# Patient Record
Sex: Male | Born: 1985 | Race: White | Hispanic: No | Marital: Married | Smoking: Never smoker
Health system: Southern US, Community
[De-identification: ages and names within clinical notes are randomized; demographics above are authoritative.]

## PROBLEM LIST (undated history)

## (undated) DIAGNOSIS — F909 Attention-deficit hyperactivity disorder, unspecified type: Secondary | ICD-10-CM

## (undated) DIAGNOSIS — F419 Anxiety disorder, unspecified: Secondary | ICD-10-CM

## (undated) DIAGNOSIS — T7840XA Allergy, unspecified, initial encounter: Secondary | ICD-10-CM

## (undated) DIAGNOSIS — E785 Hyperlipidemia, unspecified: Secondary | ICD-10-CM

## (undated) DIAGNOSIS — G4700 Insomnia, unspecified: Secondary | ICD-10-CM

## (undated) HISTORY — DX: Hyperlipidemia, unspecified: E78.5

## (undated) HISTORY — DX: Allergy, unspecified, initial encounter: T78.40XA

## (undated) HISTORY — DX: Insomnia, unspecified: G47.00

## (undated) HISTORY — DX: Anxiety disorder, unspecified: F41.9

## (undated) HISTORY — DX: Attention-deficit hyperactivity disorder, unspecified type: F90.9

## (undated) HISTORY — PX: NO PAST SURGERIES: SHX2092

---
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
## 2004-10-18 ENCOUNTER — Ambulatory Visit: Payer: Self-pay | Admitting: Internal Medicine

## 2005-02-01 ENCOUNTER — Ambulatory Visit: Payer: Self-pay | Admitting: Internal Medicine

## 2005-09-25 ENCOUNTER — Ambulatory Visit: Payer: Self-pay | Admitting: Internal Medicine

## 2006-03-30 ENCOUNTER — Ambulatory Visit: Payer: Self-pay | Admitting: Internal Medicine

## 2006-07-25 ENCOUNTER — Ambulatory Visit: Payer: Self-pay | Admitting: Internal Medicine

## 2006-07-25 ENCOUNTER — Encounter: Payer: Self-pay | Admitting: Internal Medicine

## 2006-08-22 ENCOUNTER — Telehealth (INDEPENDENT_AMBULATORY_CARE_PROVIDER_SITE_OTHER): Payer: Self-pay | Admitting: *Deleted

## 2006-12-31 ENCOUNTER — Telehealth (INDEPENDENT_AMBULATORY_CARE_PROVIDER_SITE_OTHER): Payer: Self-pay | Admitting: *Deleted

## 2007-02-06 ENCOUNTER — Ambulatory Visit: Payer: Self-pay | Admitting: Internal Medicine

## 2007-02-06 DIAGNOSIS — F988 Other specified behavioral and emotional disorders with onset usually occurring in childhood and adolescence: Secondary | ICD-10-CM | POA: Insufficient documentation

## 2007-08-07 ENCOUNTER — Telehealth (INDEPENDENT_AMBULATORY_CARE_PROVIDER_SITE_OTHER): Payer: Self-pay | Admitting: *Deleted

## 2007-10-28 ENCOUNTER — Ambulatory Visit: Payer: Self-pay | Admitting: Internal Medicine

## 2008-04-21 ENCOUNTER — Telehealth (INDEPENDENT_AMBULATORY_CARE_PROVIDER_SITE_OTHER): Payer: Self-pay | Admitting: *Deleted

## 2008-05-20 ENCOUNTER — Ambulatory Visit: Payer: Self-pay | Admitting: Internal Medicine

## 2008-05-20 DIAGNOSIS — R519 Headache, unspecified: Secondary | ICD-10-CM | POA: Insufficient documentation

## 2008-05-20 DIAGNOSIS — R51 Headache: Secondary | ICD-10-CM | POA: Insufficient documentation

## 2008-05-25 ENCOUNTER — Encounter (INDEPENDENT_AMBULATORY_CARE_PROVIDER_SITE_OTHER): Payer: Self-pay | Admitting: *Deleted

## 2008-05-25 ENCOUNTER — Telehealth (INDEPENDENT_AMBULATORY_CARE_PROVIDER_SITE_OTHER): Payer: Self-pay | Admitting: *Deleted

## 2008-10-08 ENCOUNTER — Telehealth (INDEPENDENT_AMBULATORY_CARE_PROVIDER_SITE_OTHER): Payer: Self-pay | Admitting: *Deleted

## 2009-02-01 ENCOUNTER — Telehealth (INDEPENDENT_AMBULATORY_CARE_PROVIDER_SITE_OTHER): Payer: Self-pay | Admitting: *Deleted

## 2009-08-17 ENCOUNTER — Telehealth: Payer: Self-pay | Admitting: Internal Medicine

## 2010-04-12 NOTE — Progress Notes (Signed)
Summary: fyi poison ivy  Phone Note Call from Patient Call back at Home Phone [phone]   Caller: Mom Summary of Call: pt mom called, son has poison ivy  and spreading everywhere, using Caladryl which is not helping.  Pt is at a job site he cannot leave until 5pm.  Recommend [facility] soon as pt gets off, mom agreed will tell pt. Kandice Hams  August 17, 2009 4:18 PM MOTHER CALLED BACK WANTS TO KNOW IS THERE ANYWAY DR Daymeon Fischman COULD CALL IN A RX TO [facility]?  Initial call taken by: Kandice Hams,  August 17, 2009 4:18 PM    New/Updated Medications: MOMETASONE FUROATE 0.1 % SOLN (MOMETASONE FUROATE) apply two times a day to non facial rash as needed PREDNISONE 20 MG TABS (PREDNISONE) 1 two times a day with a meal Prescriptions: PREDNISONE 20 MG TABS (PREDNISONE) 1 two times a day with a meal  #14 x 0   Entered and Authorized by:   Marga Melnick MD   Signed by:   Marga Melnick MD on 08/17/2009   Method used:   Faxed to ...       [id number] (retail)       [address]       [place], [place]  [address]       Ph: [phone] or [phone]       Fax: [phone]   RxID:   [id number] MOMETASONE FUROATE 0.1 % SOLN (MOMETASONE FUROATE) apply two times a day to non facial rash as needed  #60 grams x 0   Entered and Authorized by:   Marga Melnick MD   Signed by:   Marga Melnick MD on 08/17/2009   Method used:   Faxed to ...       [id number] (retail)       [address]       [place], [place]  [address]       Ph: [phone] or [phone]       Fax: [phone]   RxID:   [id number]

## 2011-02-15 ENCOUNTER — Ambulatory Visit (INDEPENDENT_AMBULATORY_CARE_PROVIDER_SITE_OTHER): Payer: BC Managed Care – PPO

## 2011-02-15 DIAGNOSIS — N342 Other urethritis: Secondary | ICD-10-CM

## 2011-02-17 ENCOUNTER — Ambulatory Visit: Payer: Self-pay | Admitting: Internal Medicine

## 2011-02-20 ENCOUNTER — Ambulatory Visit: Payer: Self-pay | Admitting: Internal Medicine

## 2011-02-24 ENCOUNTER — Ambulatory Visit (INDEPENDENT_AMBULATORY_CARE_PROVIDER_SITE_OTHER): Payer: Self-pay | Admitting: Internal Medicine

## 2011-02-24 ENCOUNTER — Encounter: Payer: Self-pay | Admitting: Internal Medicine

## 2011-02-24 VITALS — BP 106/70 | HR 76 | Temp 98.5°F | Wt 197.8 lb

## 2011-02-24 DIAGNOSIS — N390 Urinary tract infection, site not specified: Secondary | ICD-10-CM

## 2011-02-24 DIAGNOSIS — N453 Epididymo-orchitis: Secondary | ICD-10-CM

## 2011-02-24 DIAGNOSIS — N451 Epididymitis: Secondary | ICD-10-CM

## 2011-02-24 LAB — POCT URINALYSIS DIPSTICK
Bilirubin, UA: NEGATIVE
Blood, UA: NEGATIVE
Glucose, UA: NEGATIVE
Ketones, UA: NEGATIVE
Leukocytes, UA: NEGATIVE
Nitrite, UA: NEGATIVE
Protein, UA: NEGATIVE
Urobilinogen, UA: 0.2
pH, UA: 6

## 2011-02-24 MED ORDER — DOXYCYCLINE HYCLATE 100 MG PO CAPS: 100.0000 mg | ORAL_CAPSULE | Freq: Two times a day (BID) | ORAL | Status: AC

## 2011-02-24 NOTE — Progress Notes (Signed)
  Subjective:    Patient ID: Victor Martinez, male    DOB: 03-15-1985, 25 y.o.   MRN: [id number]  HPI Testicular Pain & LBP: Onset: 1 week ago as burning & tingling @ penile tip   Treatment/Response: [facility] Rxed Zithromax & ? another antibiotic/ symptoms better only X 1 day; STD tests subsequently negative  Course: LBP &  Testicles sensitive Symptoms Urgency: no  Frequency: no  Hesitancy: no  Hematuria: no  Flank Pain: flank & LS spine Fever: no     Nausea/Vomiting: no  STD exposure/history:yes Discharge: no Rash/ vesicles: no     Review of Systems     Objective:   Physical Exam is healthy and well-nourished in no acute distress  He has no lymphadenopathy about the neck, axilla, or inguinal areas.  There is no significant tenderness to percussion of the posterior flanks or lumbosacral spine.  Deep tendon reflexes, strength and tone normal  Straight leg raising is negative. He is able to lie flat and sit up without help  External genitalia unremarkable. He has tenderness to palpation over the left epididymis        Assessment & Plan:  #1 epididymitis  Plan: See orders and recommendations.

## 2011-02-24 NOTE — Patient Instructions (Signed)
Safe sex - if you may be exposed to STDs, use a condom.

## 2011-04-13 ENCOUNTER — Telehealth: Payer: Self-pay | Admitting: *Deleted

## 2011-04-13 DIAGNOSIS — N451 Epididymitis: Secondary | ICD-10-CM

## 2011-04-13 MED ORDER — DOXYCYCLINE HYCLATE 100 MG PO TABS: 100.0000 mg | ORAL_TABLET | Freq: Two times a day (BID) | ORAL | Status: AC

## 2011-04-13 NOTE — Telephone Encounter (Signed)
Refill doxycycline; urology evaluation recommended. Does he have a preference.

## 2011-04-13 NOTE — Telephone Encounter (Signed)
Discuss with patient, Rx sent referral put in.

## 2011-04-13 NOTE — Telephone Encounter (Signed)
Pt states that pain with urination subsided some while on med then after he finished med pain/burning return. Pt indicated that burning and pain occurs all the time not just with urination. Pt also c/o lower back pain. Pt would like to know if he can be refer to a specialist like urology or if not what is the next step in treatment.Please advise

## 2011-05-04 ENCOUNTER — Ambulatory Visit: Payer: BC Managed Care – PPO | Admitting: Family Medicine

## 2011-06-08 ENCOUNTER — Ambulatory Visit (INDEPENDENT_AMBULATORY_CARE_PROVIDER_SITE_OTHER): Payer: BC Managed Care – PPO | Admitting: Internal Medicine

## 2011-06-08 VITALS — BP 111/69 | HR 106 | Temp 99.7°F | Resp 16 | Ht 70.0 in | Wt 189.0 lb

## 2011-06-08 DIAGNOSIS — E860 Dehydration: Secondary | ICD-10-CM

## 2011-06-08 DIAGNOSIS — R1110 Vomiting, unspecified: Secondary | ICD-10-CM

## 2011-06-08 DIAGNOSIS — K529 Noninfective gastroenteritis and colitis, unspecified: Secondary | ICD-10-CM

## 2011-06-08 DIAGNOSIS — R112 Nausea with vomiting, unspecified: Secondary | ICD-10-CM

## 2011-06-08 LAB — POCT URINALYSIS DIPSTICK
Bilirubin, UA: NEGATIVE
Blood, UA: NEGATIVE
Glucose, UA: NEGATIVE
Leukocytes, UA: NEGATIVE
Nitrite, UA: NEGATIVE
Spec Grav, UA: 1.02
Urobilinogen, UA: 0.2
pH, UA: 7

## 2011-06-08 LAB — POCT UA - MICROSCOPIC ONLY
Casts, Ur, LPF, POC: NEGATIVE
Crystals, Ur, HPF, POC: NEGATIVE
Yeast, UA: NEGATIVE

## 2011-06-08 LAB — POCT CBC
Granulocyte percent: 89.8 %G — AB (ref 37–80)
HCT, POC: 47 % (ref 43.5–53.7)
Hemoglobin: 15.4 g/dL (ref 14.1–18.1)
Lymph, poc: 0.4 — AB (ref 0.6–3.4)
MCH, POC: 29.7 pg (ref 27–31.2)
MCHC: 32.8 g/dL (ref 31.8–35.4)
MCV: 90.6 fL (ref 80–97)
MID (cbc): 0.4 (ref 0–0.9)
MPV: 8.1 fL (ref 0–99.8)
POC Granulocyte: 7.9 — AB (ref 2–6.9)
POC LYMPH PERCENT: 5.1 %L — AB (ref 10–50)
POC MID %: 5.1 %M (ref 0–12)
Platelet Count, POC: 214 10*3/uL (ref 142–424)
RBC: 5.19 M/uL (ref 4.69–6.13)
RDW, POC: 13.5 %
WBC: 8.8 10*3/uL (ref 4.6–10.2)

## 2011-06-08 LAB — GLUCOSE, POCT (MANUAL RESULT ENTRY): POC Glucose: 135

## 2011-06-08 MED ORDER — ONDANSETRON HCL 4 MG/2ML IJ SOLN: 4.0000 mg | Freq: Once | INTRAMUSCULAR | Status: DC

## 2011-06-08 MED ORDER — ONDANSETRON HCL 4 MG PO TABS: 4.0000 mg | ORAL_TABLET | Freq: Three times a day (TID) | ORAL | Status: AC | PRN

## 2011-06-08 NOTE — Patient Instructions (Signed)
zofran 1 tab every 4 hours as needed for nausea and vomiting. Light diet. Crackers. No fruit or milk or cheese for 3 days. If your symptoms worsen return to the [facility]

## 2011-06-08 NOTE — Progress Notes (Signed)
  Subjective:    Patient ID: Victor Martinez, male    DOB: 1986-02-20, 26 y.o.   MRN: [id number]  HPI  Nausea vomiting  onset last night Severity 10/10 Vomit times seven since last night Unable to hold onto any fluids Has not eaten since last night  crampy abdominal pain No fever No diarrhea No rash Mild headache No stiff neck   Review of Systems  Constitutional: Positive for appetite change and fatigue.  HENT: Negative.   Eyes: Negative.   Respiratory: Negative.   Cardiovascular: Negative.   Gastrointestinal: Positive for nausea, vomiting and abdominal pain.  Genitourinary: Negative.   Musculoskeletal: Negative.   Skin: Negative.   Neurological: Negative.   Hematological: Negative.   Psychiatric/Behavioral: Negative.   All other systems reviewed and are negative.       Objective:   Physical Exam  Constitutional: He is oriented to person, place, and time. He appears well-developed and well-nourished.  HENT:  Head: Normocephalic and atraumatic.  Right Ear: External ear normal.  Left Ear: External ear normal.       Dry mucous membranes  Eyes: Conjunctivae and EOM are normal. Pupils are equal, round, and reactive to light.  Neck: Normal range of motion. Neck supple.  Cardiovascular: Normal rate, regular rhythm and normal heart sounds.        tachycardia  Pulmonary/Chest: Effort normal and breath sounds normal.  Abdominal: Soft. He exhibits no distension and no mass. There is no tenderness. There is no rebound and no guarding.  Musculoskeletal: Normal range of motion.  Neurological: He is alert and oriented to person, place, and time.  Skin: Skin is warm and dry.  Psychiatric: He has a normal mood and affect. His behavior is normal. Judgment and thought content normal.   Low grade fever.       Assessment & Plan:  vaomiting gastroenteritis possible norovirus Dehydration Will start iv hydration and administser zofran antiemetic iv.

## 2011-06-08 NOTE — Progress Notes (Signed)
  Subjective:    Patient ID: Victor Martinez, male    DOB: 03/20/1985, 26 y.o.   MRN: [id number]  HPI    Review of Systems     Objective:   Physical Exam        Assessment & Plan:

## 2011-06-08 NOTE — Progress Notes (Signed)
  Subjective:    Patient ID: Victor Martinez, male    DOB: 05/24/1985, 26 y.o.   MRN: [id number]  HPI    Review of Systems     Objective:   Physical Exam   Results for orders placed in visit on 06/08/11  POCT CBC      Component Value Range   WBC 8.8  4.6 - 10.2 (K/uL)   Lymph, poc 0.4 (*) 0.6 - 3.4    POC LYMPH PERCENT 5.1 (*) 10 - 50 (%L)   MID (cbc) 0.4  0 - 0.9    POC MID % 5.1  0 - 12 (%M)   POC Granulocyte 7.9 (*) 2 - 6.9    Granulocyte percent 89.8 (*) 37 - 80 (%G)   RBC 5.19  4.69 - 6.13 (M/uL)   Hemoglobin 15.4  14.1 - 18.1 (g/dL)   HCT, POC 11.9  14.7 - 53.7 (%)   MCV 90.6  80 - 97 (fL)   MCH, POC 29.7  27 - 31.2 (pg)   MCHC 32.8  31.8 - 35.4 (g/dL)   RDW, POC 82.9     Platelet Count, POC 214  142 - 424 (K/uL)   MPV 8.1  0 - 99.8 (fL)  GLUCOSE, POCT (MANUAL RESULT ENTRY)      Component Value Range   POC Glucose 135    POCT URINALYSIS DIPSTICK      Component Value Range   Color, UA yellow     Clarity, UA clear     Glucose, UA neg     Bilirubin, UA neg     Ketones, UA tr     Spec Grav, UA 1.020     Blood, UA neg     pH, UA 7.0     Protein, UA tr     Urobilinogen, UA 0.2     Nitrite, UA neg     Leukocytes, UA Negative    POCT UA - MICROSCOPIC ONLY      Component Value Range   WBC, Ur, HPF, POC 1-2     RBC, urine, microscopic 0-1     Bacteria, U Microscopic tr     Mucus, UA tr     Epithelial cells, urine per micros 1-2     Crystals, Ur, HPF, POC neg     Casts, Ur, LPF, POC neg     Yeast, UA neg         Assessment & Plan:  Specific gravity high . Pt is dehydrated. Wbc ok but with shift to hte left.

## 2011-06-08 NOTE — Progress Notes (Signed)
Addended by: Glennie Isle on: 06/08/2011 07:39 PM   Modules accepted: Orders

## 2014-10-19 ENCOUNTER — Encounter: Payer: Self-pay | Admitting: *Deleted

## 2014-10-19 ENCOUNTER — Telehealth: Payer: Self-pay | Admitting: *Deleted

## 2014-10-19 NOTE — Addendum Note (Signed)
Addended by: Noreene Larsson A on: 10/19/2014 09:45 AM   Modules accepted: Medications

## 2014-10-19 NOTE — Telephone Encounter (Signed)
Unable to reach patient at time of Pre-Visit Call.  Left message for patient to return call when available.    

## 2014-10-20 ENCOUNTER — Encounter: Payer: Self-pay | Admitting: Physician Assistant

## 2014-10-20 ENCOUNTER — Ambulatory Visit (INDEPENDENT_AMBULATORY_CARE_PROVIDER_SITE_OTHER): Payer: BLUE CROSS/BLUE SHIELD | Admitting: Physician Assistant

## 2014-10-20 VITALS — BP 130/70 | HR 67 | Temp 98.2°F | Ht 72.0 in | Wt 205.8 lb

## 2014-10-20 DIAGNOSIS — F988 Other specified behavioral and emotional disorders with onset usually occurring in childhood and adolescence: Secondary | ICD-10-CM

## 2014-10-20 DIAGNOSIS — Z6827 Body mass index (BMI) 27.0-27.9, adult: Secondary | ICD-10-CM | POA: Insufficient documentation

## 2014-10-20 DIAGNOSIS — Z0000 Encounter for general adult medical examination without abnormal findings: Secondary | ICD-10-CM | POA: Insufficient documentation

## 2014-10-20 DIAGNOSIS — F909 Attention-deficit hyperactivity disorder, unspecified type: Secondary | ICD-10-CM | POA: Diagnosis not present

## 2014-10-20 DIAGNOSIS — Z8042 Family history of malignant neoplasm of prostate: Secondary | ICD-10-CM

## 2014-10-20 LAB — COMPREHENSIVE METABOLIC PANEL
ALBUMIN: 4.6 g/dL (ref 3.5–5.2)
ALK PHOS: 71 U/L (ref 39–117)
ALT: 38 U/L (ref 0–53)
AST: 28 U/L (ref 0–37)
BUN: 20 mg/dL (ref 6–23)
CALCIUM: 9.6 mg/dL (ref 8.4–10.5)
CO2: 30 mEq/L (ref 19–32)
Chloride: 101 mEq/L (ref 96–112)
Creatinine, Ser: 1.04 mg/dL (ref 0.40–1.50)
GFR: 88.47 mL/min (ref >60.00)
Glucose, Bld: 93 mg/dL (ref 70–99)
Potassium: 4.1 mEq/L (ref 3.5–5.1)
SODIUM: 137 meq/L (ref 135–145)
Total Bilirubin: 0.4 mg/dL (ref 0.2–1.2)
Total Protein: 7.3 g/dL (ref 6.0–8.3)

## 2014-10-20 LAB — CBC
HCT: 44.5 % (ref 39.0–52.0)
HEMOGLOBIN: 15.2 g/dL (ref 13.0–17.0)
MCHC: 34.2 g/dL (ref 30.0–36.0)
MCV: 88.2 fl (ref 78.0–100.0)
Platelets: 241 10*3/uL (ref 150.0–400.0)
RBC: 5.04 Mil/uL (ref 4.22–5.81)
RDW: 13.3 % (ref 11.5–15.5)
WBC: 5.2 10*3/uL (ref 4.0–10.5)

## 2014-10-20 LAB — LIPID PANEL
CHOLESTEROL: 195 mg/dL (ref 0–200)
HDL: 45.2 mg/dL (ref >39.00)
LDL Cholesterol: 131 mg/dL — ABNORMAL HIGH (ref 0–99)
NonHDL: 149.9
TRIGLYCERIDES: 94 mg/dL (ref 0.0–149.0)
Total CHOL/HDL Ratio: 4
VLDL: 18.8 mg/dL (ref 0.0–40.0)

## 2014-10-20 LAB — URINALYSIS, ROUTINE W REFLEX MICROSCOPIC
BILIRUBIN URINE: NEGATIVE
HGB URINE DIPSTICK: NEGATIVE
KETONES UR: NEGATIVE
LEUKOCYTES UA: NEGATIVE
NITRITE: NEGATIVE
RBC / HPF: NONE SEEN (ref 0–?)
Specific Gravity, Urine: 1.02 (ref 1.000–1.030)
TOTAL PROTEIN, URINE-UPE24: NEGATIVE
UROBILINOGEN UA: 0.2 (ref 0.0–1.0)
Urine Glucose: NEGATIVE
WBC UA: NONE SEEN (ref 0–?)
pH: 6.5 (ref 5.0–8.0)

## 2014-10-20 LAB — HEMOGLOBIN A1C: Hgb A1c MFr Bld: 5.1 % (ref 4.6–6.5)

## 2014-10-20 LAB — TSH: TSH: 2.9 u[IU]/mL (ref 0.35–4.50)

## 2014-10-20 NOTE — Progress Notes (Signed)
Pre visit review using our clinic review tool, if applicable. No additional management support is needed unless otherwise documented below in the visit note. 

## 2014-10-20 NOTE — Assessment & Plan Note (Signed)
Depression screen negative. Health Maintenance up-to-date. Preventive schedule discussed. Handout given in AVS. Will obtain fasting labs today.

## 2014-10-20 NOTE — Patient Instructions (Signed)
Please go to the lab for blood work. I will call you with your results. If all is good we will follow-up yearly for ADHD and physicals.  If anything is abnormal, we will treat you accordingly and get you back in for follow-up.  Please continue medications as directed.  Preventive Care for Adults A healthy lifestyle and preventive care can promote health and wellness. Preventive health guidelines for men include the following key practices:  A routine yearly physical is a good way to check with your health care provider about your health and preventative screening. It is a chance to share any concerns and updates on your health and to receive a thorough exam.  Visit your dentist for a routine exam and preventative care every 6 months. Brush your teeth twice a day and floss once a day. Good oral hygiene prevents tooth decay and gum disease.  The frequency of eye exams is based on your age, health, family medical history, use of contact lenses, and other factors. Follow your health care provider's recommendations for frequency of eye exams.  Eat a healthy diet. Foods such as vegetables, fruits, whole grains, low-fat dairy products, and lean protein foods contain the nutrients you need without too many calories. Decrease your intake of foods high in solid fats, added sugars, and salt. Eat the right amount of calories for you.Get information about a proper diet from your health care provider, if necessary.  Regular physical exercise is one of the most important things you can do for your health. Most adults should get at least 150 minutes of moderate-intensity exercise (any activity that increases your heart rate and causes you to sweat) each week. In addition, most adults need muscle-strengthening exercises on 2 or more days a week.  Maintain a healthy weight. The body mass index (BMI) is a screening tool to identify possible weight problems. It provides an estimate of body fat based on height and  weight. Your health care provider can find your BMI and can help you achieve or maintain a healthy weight.For adults 20 years and older:  A BMI below 18.5 is considered underweight.  A BMI of 18.5 to 24.9 is normal.  A BMI of 25 to 29.9 is considered overweight.  A BMI of 30 and above is considered obese.  Maintain normal blood lipids and cholesterol levels by exercising and minimizing your intake of saturated fat. Eat a balanced diet with plenty of fruit and vegetables. Blood tests for lipids and cholesterol should begin at age 59 and be repeated every 5 years. If your lipid or cholesterol levels are high, you are over 50, or you are at high risk for heart disease, you may need your cholesterol levels checked more frequently.Ongoing high lipid and cholesterol levels should be treated with medicines if diet and exercise are not working.  If you smoke, find out from your health care provider how to quit. If you do not use tobacco, do not start.  Lung cancer screening is recommended for adults aged 47-80 years who are at high risk for developing lung cancer because of a history of smoking. A yearly low-dose CT scan of the lungs is recommended for people who have at least a 30-pack-year history of smoking and are a current smoker or have quit within the past 15 years. A pack year of smoking is smoking an average of 1 pack of cigarettes a day for 1 year (for example: 1 pack a day for 30 years or 2 packs a day  for 15 years). Yearly screening should continue until the smoker has stopped smoking for at least 15 years. Yearly screening should be stopped for people who develop a health problem that would prevent them from having lung cancer treatment.  If you choose to drink alcohol, do not have more than 2 drinks per day. One drink is considered to be 12 ounces (355 mL) of beer, 5 ounces (148 mL) of wine, or 1.5 ounces (44 mL) of liquor.  Avoid use of street drugs. Do not share needles with anyone. Ask  for help if you need support or instructions about stopping the use of drugs.  High blood pressure causes heart disease and increases the risk of stroke. Your blood pressure should be checked at least every 1-2 years. Ongoing high blood pressure should be treated with medicines, if weight loss and exercise are not effective.  If you are 37-39 years old, ask your health care provider if you should take aspirin to prevent heart disease.  Diabetes screening involves taking a blood sample to check your fasting blood sugar level. This should be done once every 3 years, after age 35, if you are within normal weight and without risk factors for diabetes. Testing should be considered at a younger age or be carried out more frequently if you are overweight and have at least 1 risk factor for diabetes.  Colorectal cancer can be detected and often prevented. Most routine colorectal cancer screening begins at the age of 13 and continues through age 64. However, your health care provider may recommend screening at an earlier age if you have risk factors for colon cancer. On a yearly basis, your health care provider may provide home test kits to check for hidden blood in the stool. Use of a small camera at the end of a tube to directly examine the colon (sigmoidoscopy or colonoscopy) can detect the earliest forms of colorectal cancer. Talk to your health care provider about this at age 56, when routine screening begins. Direct exam of the colon should be repeated every 5-10 years through age 43, unless early forms of precancerous polyps or small growths are found.  People who are at an increased risk for hepatitis B should be screened for this virus. You are considered at high risk for hepatitis B if:  You were born in a country where hepatitis B occurs often. Talk with your health care provider about which countries are considered high risk.  Your parents were born in a high-risk country and you have not received a  shot to protect against hepatitis B (hepatitis B vaccine).  You have HIV or AIDS.  You use needles to inject street drugs.  You live with, or have sex with, someone who has hepatitis B.  You are a man who has sex with other men (MSM).  You get hemodialysis treatment.  You take certain medicines for conditions such as cancer, organ transplantation, and autoimmune conditions.  Hepatitis C blood testing is recommended for all people born from [birth year] through 1965 and any individual with known risks for hepatitis C.  Practice safe sex. Use condoms and avoid high-risk sexual practices to reduce the spread of sexually transmitted infections (STIs). STIs include gonorrhea, chlamydia, syphilis, trichomonas, herpes, HPV, and human immunodeficiency virus (HIV). Herpes, HIV, and HPV are viral illnesses that have no cure. They can result in disability, cancer, and death.  If you are at risk of being infected with HIV, it is recommended that you take a prescription medicine  daily to prevent HIV infection. This is called preexposure prophylaxis (PrEP). You are considered at risk if:  You are a man who has sex with other men (MSM) and have other risk factors.  You are a heterosexual man, are sexually active, and are at increased risk for HIV infection.  You take drugs by injection.  You are sexually active with a partner who has HIV.  Talk with your health care provider about whether you are at high risk of being infected with HIV. If you choose to begin PrEP, you should first be tested for HIV. You should then be tested every 3 months for as long as you are taking PrEP.  A one-time screening for abdominal aortic aneurysm (AAA) and surgical repair of large AAAs by ultrasound are recommended for men ages 52 to 42 years who are current or former smokers.  Healthy men should no longer receive prostate-specific antigen (PSA) blood tests as part of routine cancer screening. Talk with your health care  provider about prostate cancer screening.  Testicular cancer screening is not recommended for adult males who have no symptoms. Screening includes self-exam, a health care provider exam, and other screening tests. Consult with your health care provider about any symptoms you have or any concerns you have about testicular cancer.  Use sunscreen. Apply sunscreen liberally and repeatedly throughout the day. You should seek shade when your shadow is shorter than you. Protect yourself by wearing long sleeves, pants, a wide-brimmed hat, and sunglasses year round, whenever you are outdoors.  Once a month, do a whole-body skin exam, using a mirror to look at the skin on your back. Tell your health care provider about new moles, moles that have irregular borders, moles that are larger than a pencil eraser, or moles that have changed in shape or color.  Stay current with required vaccines (immunizations).  Influenza vaccine. All adults should be immunized every year.  Tetanus, diphtheria, and acellular pertussis (Td, Tdap) vaccine. An adult who has not previously received Tdap or who does not know his vaccine status should receive 1 dose of Tdap. This initial dose should be followed by tetanus and diphtheria toxoids (Td) booster doses every 10 years. Adults with an unknown or incomplete history of completing a 3-dose immunization series with Td-containing vaccines should begin or complete a primary immunization series including a Tdap dose. Adults should receive a Td booster every 10 years.  Varicella vaccine. An adult without evidence of immunity to varicella should receive 2 doses or a second dose if he has previously received 1 dose.  Human papillomavirus (HPV) vaccine. Males aged 42-21 years who have not received the vaccine previously should receive the 3-dose series. Males aged 22-26 years may be immunized. Immunization is recommended through the age of 6 years for any male who has sex with males and  did not get any or all doses earlier. Immunization is recommended for any person with an immunocompromised condition through the age of 24 years if he did not get any or all doses earlier. During the 3-dose series, the second dose should be obtained 4-8 weeks after the first dose. The third dose should be obtained 24 weeks after the first dose and 16 weeks after the second dose.  Zoster vaccine. One dose is recommended for adults aged 38 years or older unless certain conditions are present.  Measles, mumps, and rubella (MMR) vaccine. Adults born before [birth year] generally are considered immune to measles and mumps. Adults born in [birth year] or later  should have 1 or more doses of MMR vaccine unless there is a contraindication to the vaccine or there is laboratory evidence of immunity to each of the three diseases. A routine second dose of MMR vaccine should be obtained at least 28 days after the first dose for students attending postsecondary schools, health care workers, or international travelers. People who received inactivated measles vaccine or an unknown type of measles vaccine during 1963-1967 should receive 2 doses of MMR vaccine. People who received inactivated mumps vaccine or an unknown type of mumps vaccine before 1979 and are at high risk for mumps infection should consider immunization with 2 doses of MMR vaccine. Unvaccinated health care workers born before [birth year] who lack laboratory evidence of measles, mumps, or rubella immunity or laboratory confirmation of disease should consider measles and mumps immunization with 2 doses of MMR vaccine or rubella immunization with 1 dose of MMR vaccine.  Pneumococcal 13-valent conjugate (PCV13) vaccine. When indicated, a person who is uncertain of his immunization history and has no record of immunization should receive the PCV13 vaccine. An adult aged 24 years or older who has certain medical conditions and has not been previously immunized should receive 1 dose of  PCV13 vaccine. This PCV13 should be followed with a dose of pneumococcal polysaccharide (PPSV23) vaccine. The PPSV23 vaccine dose should be obtained at least 8 weeks after the dose of PCV13 vaccine. An adult aged 26 years or older who has certain medical conditions and previously received 1 or more doses of PPSV23 vaccine should receive 1 dose of PCV13. The PCV13 vaccine dose should be obtained 1 or more years after the last PPSV23 vaccine dose.  Pneumococcal polysaccharide (PPSV23) vaccine. When PCV13 is also indicated, PCV13 should be obtained first. All adults aged 2 years and older should be immunized. An adult younger than age 30 years who has certain medical conditions should be immunized. Any person who resides in a nursing home or long-term care facility should be immunized. An adult smoker should be immunized. People with an immunocompromised condition and certain other conditions should receive both PCV13 and PPSV23 vaccines. People with human immunodeficiency virus (HIV) infection should be immunized as soon as possible after diagnosis. Immunization during chemotherapy or radiation therapy should be avoided. Routine use of PPSV23 vaccine is not recommended for American Indians, [place] Natives, or people younger than 65 years unless there are medical conditions that require PPSV23 vaccine. When indicated, people who have unknown immunization and have no record of immunization should receive PPSV23 vaccine. One-time revaccination 5 years after the first dose of PPSV23 is recommended for people aged 19-64 years who have chronic kidney failure, nephrotic syndrome, asplenia, or immunocompromised conditions. People who received 1-2 doses of PPSV23 before age 48 years should receive another dose of PPSV23 vaccine at age 18 years or later if at least 5 years have passed since the previous dose. Doses of PPSV23 are not needed for people immunized with PPSV23 at or after age 33 years.  Meningococcal vaccine.  Adults with asplenia or persistent complement component deficiencies should receive 2 doses of quadrivalent meningococcal conjugate (MenACWY-D) vaccine. The doses should be obtained at least 2 months apart. Microbiologists working with certain meningococcal bacteria, [employer] recruits, people at risk during an outbreak, and people who travel to or live in countries with a high rate of meningitis should be immunized. A first-year college student up through age 51 years who is living in a residence hall should receive a dose if he did not  receive a dose on or after his 16th birthday. Adults who have certain high-risk conditions should receive one or more doses of vaccine.  Hepatitis A vaccine. Adults who wish to be protected from this disease, have certain high-risk conditions, work with hepatitis A-infected animals, work in hepatitis A research labs, or travel to or work in countries with a high rate of hepatitis A should be immunized. Adults who were previously unvaccinated and who anticipate close contact with an international adoptee during the first 60 days after arrival in the [place] from a country with a high rate of hepatitis A should be immunized.  Hepatitis B vaccine. Adults should be immunized if they wish to be protected from this disease, have certain high-risk conditions, may be exposed to blood or other infectious body fluids, are household contacts or sex partners of hepatitis B positive people, are clients or workers in certain care facilities, or travel to or work in countries with a high rate of hepatitis B.  Haemophilus influenzae type b (Hib) vaccine. A previously unvaccinated person with asplenia or sickle cell disease or having a scheduled splenectomy should receive 1 dose of Hib vaccine. Regardless of previous immunization, a recipient of a hematopoietic stem cell transplant should receive a 3-dose series 6-12 months after his successful transplant. Hib vaccine is not recommended  for adults with HIV infection. Preventive Service / Frequency Ages 29 to 93  Blood pressure check.** / Every 1 to 2 years.  Lipid and cholesterol check.** / Every 5 years beginning at age 77.  Hepatitis C blood test.** / For any individual with known risks for hepatitis C.  Skin self-exam. / Monthly.  Influenza vaccine. / Every year.  Tetanus, diphtheria, and acellular pertussis (Tdap, Td) vaccine.** / Consult your health care provider. 1 dose of Td every 10 years.  Varicella vaccine.** / Consult your health care provider.  HPV vaccine. / 3 doses over 6 months, if 39 or younger.  Measles, mumps, rubella (MMR) vaccine.** / You need at least 1 dose of MMR if you were born in 1959 or later. You may also need a second dose.  Pneumococcal 13-valent conjugate (PCV13) vaccine.** / Consult your health care provider.  Pneumococcal polysaccharide (PPSV23) vaccine.** / 1 to 2 doses if you smoke cigarettes or if you have certain conditions.  Meningococcal vaccine.** / 1 dose if you are age 61 to 24 years and a Market researcher living in a residence hall, or have one of several medical conditions. You may also need additional booster doses.  Hepatitis A vaccine.** / Consult your health care provider.  Hepatitis B vaccine.** / Consult your health care provider.  Haemophilus influenzae type b (Hib) vaccine.** / Consult your health care provider. Ages 43 to 43  Blood pressure check.** / Every 1 to 2 years.  Lipid and cholesterol check.** / Every 5 years beginning at age 4.  Lung cancer screening. / Every year if you are aged 60-80 years and have a 30-pack-year history of smoking and currently smoke or have quit within the past 15 years. Yearly screening is stopped once you have quit smoking for at least 15 years or develop a health problem that would prevent you from having lung cancer treatment.  Fecal occult blood test (FOBT) of stool. / Every year beginning at age 54 and  continuing until age 47. You may not have to do this test if you get a colonoscopy every 10 years.  Flexible sigmoidoscopy** or colonoscopy.** / Every 5 years for  a flexible sigmoidoscopy or every 10 years for a colonoscopy beginning at age 58 and continuing until age 45.  Hepatitis C blood test.** / For all people born from [birth year] through 1965 and any individual with known risks for hepatitis C.  Skin self-exam. / Monthly.  Influenza vaccine. / Every year.  Tetanus, diphtheria, and acellular pertussis (Tdap/Td) vaccine.** / Consult your health care provider. 1 dose of Td every 10 years.  Varicella vaccine.** / Consult your health care provider.  Zoster vaccine.** / 1 dose for adults aged 5 years or older.  Measles, mumps, rubella (MMR) vaccine.** / You need at least 1 dose of MMR if you were born in 1959 or later. You may also need a second dose.  Pneumococcal 13-valent conjugate (PCV13) vaccine.** / Consult your health care provider.  Pneumococcal polysaccharide (PPSV23) vaccine.** / 1 to 2 doses if you smoke cigarettes or if you have certain conditions.  Meningococcal vaccine.** / Consult your health care provider.  Hepatitis A vaccine.** / Consult your health care provider.  Hepatitis B vaccine.** / Consult your health care provider.  Haemophilus influenzae type b (Hib) vaccine.** / Consult your health care provider. Ages 50 and over  Blood pressure check.** / Every 1 to 2 years.  Lipid and cholesterol check.**/ Every 5 years beginning at age 10.  Lung cancer screening. / Every year if you are aged 43-80 years and have a 30-pack-year history of smoking and currently smoke or have quit within the past 15 years. Yearly screening is stopped once you have quit smoking for at least 15 years or develop a health problem that would prevent you from having lung cancer treatment.  Fecal occult blood test (FOBT) of stool. / Every year beginning at age 107 and continuing until age 74. You  may not have to do this test if you get a colonoscopy every 10 years.  Flexible sigmoidoscopy** or colonoscopy.** / Every 5 years for a flexible sigmoidoscopy or every 10 years for a colonoscopy beginning at age 32 and continuing until age 65.  Hepatitis C blood test.** / For all people born from [birth year] through 1967 and any individual with known risks for hepatitis C.  Abdominal aortic aneurysm (AAA) screening.** / A one-time screening for ages 45 to 16 years who are current or former smokers.  Skin self-exam. / Monthly.  Influenza vaccine. / Every year.  Tetanus, diphtheria, and acellular pertussis (Tdap/Td) vaccine.** / 1 dose of Td every 10 years.  Varicella vaccine.** / Consult your health care provider.  Zoster vaccine.** / 1 dose for adults aged 10 years or older.  Pneumococcal 13-valent conjugate (PCV13) vaccine.** / Consult your health care provider.  Pneumococcal polysaccharide (PPSV23) vaccine.** / 1 dose for all adults aged 59 years and older.  Meningococcal vaccine.** / Consult your health care provider.  Hepatitis A vaccine.** / Consult your health care provider.  Hepatitis B vaccine.** / Consult your health care provider.  Haemophilus influenzae type b (Hib) vaccine.** / Consult your health care provider. **Family history and personal history of risk and conditions may change your health care provider's recommendations. Document Released: 04/25/2001 Document Revised: 03/04/2013 Document Reviewed: 07/25/2010 [facility] Patient Information 2015 [place], [place]. This information is not intended to replace advice given to you by your health care provider. Make sure you discuss any questions you have with your health care provider.

## 2014-10-20 NOTE — Assessment & Plan Note (Signed)
Father with onset at age 29. Will begin screening at 43 unless patient symptomatic, in which case will check sooner.

## 2014-10-20 NOTE — Progress Notes (Signed)
Patient presents to clinic today to establish care. Is requesting CPE. Is fasting for labs. Patient notes his brother was just found to have high cholesterol. Is going to the gym 3 x week. Endorses poor diet.  Health Maintenance: Dental -- up-to-date Vision -- Overdue Immunizations -- tetanus up-to-date  Past Medical History  Diagnosis Date  . ADHD (attention deficit hyperactivity disorder)     Mild    Past Surgical History  Procedure Laterality Date  . No past surgeries      Current Outpatient Prescriptions on File Prior to Visit  Medication Sig Dispense Refill  . Multiple Vitamin (MULTIVITAMIN) tablet Take 1 tablet by mouth daily.    . Omega-3 Fatty Acids (FISH OIL CONCENTRATE PO) Take 1 capsule by mouth daily.     No current facility-administered medications on file prior to visit.    No Known Allergies  Family History  Problem Relation Age of Onset  . Multiple sclerosis Mother   . Cancer Father 84    Prostate  . Hyperlipidemia Father     History   Social History  . Marital Status: Single    Spouse Name: N/A  . Number of Children: 1  . Years of Education: N/A   Occupational History  . Engineering    Social History Main Topics  . Smoking status: Never Smoker   . Smokeless tobacco: Never Used  . Alcohol Use: 0.0 oz/week    0 Standard drinks or equivalent per week  . Drug Use: No  . Sexual Activity: Yes   Other Topics Concern  . Not on file   Social History Narrative   Review of Systems  Constitutional: Negative for fever and weight loss.  HENT: Negative for ear discharge, ear pain, hearing loss and tinnitus.   Eyes: Negative for blurred vision, double vision, photophobia and pain.  Respiratory: Negative for cough and shortness of breath.   Cardiovascular: Negative for chest pain and palpitations.  Gastrointestinal: Negative for heartburn, nausea, vomiting, abdominal pain, diarrhea, constipation, blood in stool and melena.  Genitourinary:  Negative for dysuria, urgency, frequency, hematuria and flank pain.  Musculoskeletal: Negative for falls.  Neurological: Negative for dizziness, loss of consciousness and headaches.  Endo/Heme/Allergies: Negative for environmental allergies.  Psychiatric/Behavioral: Negative for depression, suicidal ideas, hallucinations and substance abuse. The patient is not nervous/anxious and does not have insomnia.    BP 130/70 mmHg  Pulse 67  Temp(Src) 98.2 F (36.8 C) (Oral)  Ht 6' (1.829 m)  Wt 205 lb 12.8 oz (93.35 kg)  BMI 27.91 kg/m2  SpO2 99%  Physical Exam  Constitutional: He is oriented to person, place, and time and well-developed, well-nourished, and in no distress.  HENT:  Head: Normocephalic and atraumatic.  Right Ear: External ear normal.  Left Ear: External ear normal.  Nose: Nose normal.  Mouth/Throat: Oropharynx is clear and moist. No oropharyngeal exudate.  Eyes: Conjunctivae and EOM are normal. Pupils are equal, round, and reactive to light.  Neck: Neck supple. No thyromegaly present.  Cardiovascular: Normal rate, regular rhythm, normal heart sounds and intact distal pulses.   Pulmonary/Chest: Effort normal and breath sounds normal. No respiratory distress. He has no wheezes. He has no rales. He exhibits no tenderness.  Abdominal: Soft. Bowel sounds are normal. He exhibits no distension and no mass. There is no tenderness. There is no rebound and no guarding.  Genitourinary: Testes/scrotum normal and penis normal. No discharge found.  Lymphadenopathy:    He has no cervical adenopathy.  Neurological: He  is alert and oriented to person, place, and time.  Skin: Skin is warm and dry. No rash noted.  Psychiatric: Affect normal.  Vitals reviewed.  Assessment/Plan: Visit for preventive health examination Depression screen negative. Health Maintenance up-to-date. Preventive schedule discussed. Handout given in AVS. Will obtain fasting labs today.  Attention deficit  disorder Mild. Uses Adderall only during very productive/stressful times at work where he cannot get a handle on symptoms. Continue current regimen. Will have sign CSC and give UDS at time of refill.  Family hx of prostate cancer Father with onset at age 40. Will begin screening at 43 unless patient symptomatic, in which case will check sooner.

## 2014-10-20 NOTE — Assessment & Plan Note (Signed)
Mild. Uses Adderall only during very productive/stressful times at work where he cannot get a handle on symptoms. Continue current regimen. Will have sign CSC and give UDS at time of refill.

## 2014-10-22 ENCOUNTER — Encounter: Payer: Self-pay | Admitting: Physician Assistant

## 2014-12-29 ENCOUNTER — Encounter: Payer: Self-pay | Admitting: Physician Assistant

## 2014-12-29 MED ORDER — AMPHETAMINE-DEXTROAMPHETAMINE 10 MG PO TABS: 10.0000 mg | ORAL_TABLET | Freq: Every day | ORAL | Status: DC

## 2014-12-30 MED ORDER — ZOLPIDEM TARTRATE 10 MG PO TABS: 10.0000 mg | ORAL_TABLET | Freq: Every evening | ORAL | Status: DC | PRN

## 2014-12-30 NOTE — Telephone Encounter (Signed)
Please phone in the patient's Ambien prescription if possible (pended above). Otherwise have someone please sign in my absence and fax in.

## 2014-12-30 NOTE — Addendum Note (Signed)
Addended by: Marcelline MatesMARTIN, Montrey Buist on: 12/30/2014 01:08 PM   Modules accepted: Orders

## 2014-12-30 NOTE — Addendum Note (Signed)
Addended by: Regis BillSCATES, SHARON L on: 12/30/2014 05:52 PM   Modules accepted: Orders

## 2015-02-18 ENCOUNTER — Telehealth: Payer: Self-pay | Admitting: Physician Assistant

## 2015-02-18 NOTE — Telephone Encounter (Signed)
mychart msg.  

## 2015-02-19 ENCOUNTER — Encounter: Payer: Self-pay | Admitting: Physician Assistant

## 2015-02-19 ENCOUNTER — Other Ambulatory Visit: Payer: Self-pay | Admitting: Physician Assistant

## 2015-02-19 DIAGNOSIS — K649 Unspecified hemorrhoids: Secondary | ICD-10-CM

## 2015-02-19 MED ORDER — HYDROCORTISONE ACE-PRAMOXINE 1-1 % RE FOAM: 1.0000 | Freq: Two times a day (BID) | RECTAL | Status: DC

## 2015-02-19 NOTE — Telephone Encounter (Signed)
PtI received the flu shot 10/26 at work

## 2015-02-22 NOTE — Telephone Encounter (Signed)
Noted in HM. 

## 2015-02-23 MED ORDER — HYDROCORTISONE ACETATE 25 MG RE SUPP: 25.0000 mg | Freq: Two times a day (BID) | RECTAL | Status: DC

## 2015-05-06 ENCOUNTER — Other Ambulatory Visit: Payer: Self-pay | Admitting: Physician Assistant

## 2015-05-06 ENCOUNTER — Encounter: Payer: Self-pay | Admitting: Physician Assistant

## 2015-05-06 MED ORDER — ONDANSETRON HCL 8 MG PO TABS: 8.0000 mg | ORAL_TABLET | Freq: Three times a day (TID) | ORAL | Status: DC | PRN

## 2015-08-27 ENCOUNTER — Other Ambulatory Visit: Payer: Self-pay | Admitting: Emergency Medicine

## 2015-08-27 ENCOUNTER — Other Ambulatory Visit: Payer: Self-pay | Admitting: Physician Assistant

## 2015-08-27 DIAGNOSIS — F988 Other specified behavioral and emotional disorders with onset usually occurring in childhood and adolescence: Secondary | ICD-10-CM

## 2015-08-27 MED ORDER — AMPHETAMINE-DEXTROAMPHETAMINE 10 MG PO TABS: 10.0000 mg | ORAL_TABLET | Freq: Every day | ORAL | Status: DC

## 2015-08-27 NOTE — Telephone Encounter (Signed)
He is due for follow-up appointment. Every 6 months for ADD getting controlled medications. Can give 1 month supply only. No further fills until appointment.

## 2015-08-27 NOTE — Telephone Encounter (Signed)
Received refill request for ADDERALL.   Last OV: 10/20/2014 Last refill: 12/29/2014  Is it ok to refill. Please advise. Thanks.

## 2015-11-30 ENCOUNTER — Ambulatory Visit (INDEPENDENT_AMBULATORY_CARE_PROVIDER_SITE_OTHER): Payer: BLUE CROSS/BLUE SHIELD | Admitting: Physician Assistant

## 2015-11-30 ENCOUNTER — Encounter: Payer: Self-pay | Admitting: Physician Assistant

## 2015-11-30 VITALS — BP 119/76 | HR 76 | Temp 98.3°F | Resp 16 | Ht 72.0 in | Wt 205.2 lb

## 2015-11-30 DIAGNOSIS — F909 Attention-deficit hyperactivity disorder, unspecified type: Secondary | ICD-10-CM | POA: Diagnosis not present

## 2015-11-30 DIAGNOSIS — Z0000 Encounter for general adult medical examination without abnormal findings: Secondary | ICD-10-CM | POA: Diagnosis not present

## 2015-11-30 DIAGNOSIS — F988 Other specified behavioral and emotional disorders with onset usually occurring in childhood and adolescence: Secondary | ICD-10-CM

## 2015-11-30 LAB — COMPREHENSIVE METABOLIC PANEL
ALBUMIN: 4.6 g/dL (ref 3.5–5.2)
ALK PHOS: 74 U/L (ref 39–117)
ALT: 44 U/L (ref 0–53)
AST: 20 U/L (ref 0–37)
BILIRUBIN TOTAL: 0.6 mg/dL (ref 0.2–1.2)
BUN: 18 mg/dL (ref 6–23)
CALCIUM: 9.2 mg/dL (ref 8.4–10.5)
CO2: 32 mEq/L (ref 19–32)
CREATININE: 1.17 mg/dL (ref 0.40–1.50)
Chloride: 101 mEq/L (ref 96–112)
GFR: 76.67 mL/min (ref >60.00)
Glucose, Bld: 92 mg/dL (ref 70–99)
Potassium: 3.8 mEq/L (ref 3.5–5.1)
Sodium: 138 mEq/L (ref 135–145)
TOTAL PROTEIN: 7.4 g/dL (ref 6.0–8.3)

## 2015-11-30 LAB — URINALYSIS, ROUTINE W REFLEX MICROSCOPIC
Bilirubin Urine: NEGATIVE
Ketones, ur: NEGATIVE
LEUKOCYTES UA: NEGATIVE
Nitrite: NEGATIVE
SPECIFIC GRAVITY, URINE: 1.025 (ref 1.000–1.030)
TOTAL PROTEIN, URINE-UPE24: NEGATIVE
UROBILINOGEN UA: 0.2 (ref 0.0–1.0)
Urine Glucose: NEGATIVE
pH: 6 (ref 5.0–8.0)

## 2015-11-30 LAB — LIPID PANEL
CHOLESTEROL: 199 mg/dL (ref 0–200)
HDL: 50.2 mg/dL (ref >39.00)
LDL Cholesterol: 130 mg/dL — ABNORMAL HIGH (ref 0–99)
NonHDL: 149.14
TRIGLYCERIDES: 94 mg/dL (ref 0.0–149.0)
Total CHOL/HDL Ratio: 4
VLDL: 18.8 mg/dL (ref 0.0–40.0)

## 2015-11-30 LAB — CBC
HEMATOCRIT: 43.6 % (ref 39.0–52.0)
Hemoglobin: 15.3 g/dL (ref 13.0–17.0)
MCHC: 35.1 g/dL (ref 30.0–36.0)
MCV: 86.5 fl (ref 78.0–100.0)
Platelets: 256 10*3/uL (ref 150.0–400.0)
RBC: 5.05 Mil/uL (ref 4.22–5.81)
RDW: 13.4 % (ref 11.5–15.5)
WBC: 5.5 10*3/uL (ref 4.0–10.5)

## 2015-11-30 LAB — HEMOGLOBIN A1C: Hgb A1c MFr Bld: 5.2 % (ref 4.6–6.5)

## 2015-11-30 MED ORDER — AMPHETAMINE-DEXTROAMPHETAMINE 10 MG PO TABS: 10.0000 mg | ORAL_TABLET | Freq: Every day | ORAL | 0 refills | Status: DC

## 2015-11-30 MED ORDER — ZOLPIDEM TARTRATE 10 MG PO TABS: 10.0000 mg | ORAL_TABLET | Freq: Every evening | ORAL | 4 refills | Status: DC | PRN

## 2015-11-30 NOTE — Assessment & Plan Note (Signed)
Depression screen negative. Health Maintenance reviewed -- Will be getting flu shot at work on 12/15/15. Preventive schedule discussed and handout given in AVS. Will obtain fasting labs today.

## 2015-11-30 NOTE — Patient Instructions (Signed)
Please go to the lab for blood work.   Our office will call you with your results unless you have chosen to receive results via MyChart.  If your blood work is normal we will follow-up each year for physicals and as scheduled for chronic medical problems.  If anything is abnormal we will treat accordingly and get you in for a follow-up.  Please continue medications as directed. Follow-up in 6 months.  Preventive Care for Adults, Male A healthy lifestyle and preventive care can promote health and wellness. Preventive health guidelines for men include the following key practices:  A routine yearly physical is a good way to check with your health care provider about your health and preventative screening. It is a chance to share any concerns and updates on your health and to receive a thorough exam.  Visit your dentist for a routine exam and preventative care every 6 months. Brush your teeth twice a day and floss once a day. Good oral hygiene prevents tooth decay and gum disease.  The frequency of eye exams is based on your age, health, family medical history, use of contact lenses, and other factors. Follow your health care provider's recommendations for frequency of eye exams.  Eat a healthy diet. Foods such as vegetables, fruits, whole grains, low-fat dairy products, and lean protein foods contain the nutrients you need without too many calories. Decrease your intake of foods high in solid fats, added sugars, and salt. Eat the right amount of calories for you.Get information about a proper diet from your health care provider, if necessary.  Regular physical exercise is one of the most important things you can do for your health. Most adults should get at least 150 minutes of moderate-intensity exercise (any activity that increases your heart rate and causes you to sweat) each week. In addition, most adults need muscle-strengthening exercises on 2 or more days a week.  Maintain a healthy  weight. The body mass index (BMI) is a screening tool to identify possible weight problems. It provides an estimate of body fat based on height and weight. Your health care provider can find your BMI and can help you achieve or maintain a healthy weight.For adults 20 years and older:  A BMI below 18.5 is considered underweight.  A BMI of 18.5 to 24.9 is normal.  A BMI of 25 to 29.9 is considered overweight.  A BMI of 30 and above is considered obese.  Maintain normal blood lipids and cholesterol levels by exercising and minimizing your intake of saturated fat. Eat a balanced diet with plenty of fruit and vegetables. Blood tests for lipids and cholesterol should begin at age 24 and be repeated every 5 years. If your lipid or cholesterol levels are high, you are over 50, or you are at high risk for heart disease, you may need your cholesterol levels checked more frequently.Ongoing high lipid and cholesterol levels should be treated with medicines if diet and exercise are not working.  If you smoke, find out from your health care provider how to quit. If you do not use tobacco, do not start.  Lung cancer screening is recommended for adults aged 36-80 years who are at high risk for developing lung cancer because of a history of smoking. A yearly low-dose CT scan of the lungs is recommended for people who have at least a 30-pack-year history of smoking and are a current smoker or have quit within the past 15 years. A pack year of smoking is smoking an  average of 1 pack of cigarettes a day for 1 year (for example: 1 pack a day for 30 years or 2 packs a day for 15 years). Yearly screening should continue until the smoker has stopped smoking for at least 15 years. Yearly screening should be stopped for people who develop a health problem that would prevent them from having lung cancer treatment.  If you choose to drink alcohol, do not have more than 2 drinks per day. One drink is considered to be 12 ounces  (355 mL) of beer, 5 ounces (148 mL) of wine, or 1.5 ounces (44 mL) of liquor.  Avoid use of street drugs. Do not share needles with anyone. Ask for help if you need support or instructions about stopping the use of drugs.  High blood pressure causes heart disease and increases the risk of stroke. Your blood pressure should be checked at least every 1-2 years. Ongoing high blood pressure should be treated with medicines, if weight loss and exercise are not effective.  If you are 35-65 years old, ask your health care provider if you should take aspirin to prevent heart disease.  Diabetes screening is done by taking a blood sample to check your blood glucose level after you have not eaten for a certain period of time (fasting). If you are not overweight and you do not have risk factors for diabetes, you should be screened once every 3 years starting at age 36. If you are overweight or obese and you are 87-62 years of age, you should be screened for diabetes every year as part of your cardiovascular risk assessment.  Colorectal cancer can be detected and often prevented. Most routine colorectal cancer screening begins at the age of 83 and continues through age 35. However, your health care provider may recommend screening at an earlier age if you have risk factors for colon cancer. On a yearly basis, your health care provider may provide home test kits to check for hidden blood in the stool. Use of a small camera at the end of a tube to directly examine the colon (sigmoidoscopy or colonoscopy) can detect the earliest forms of colorectal cancer. Talk to your health care provider about this at age 22, when routine screening begins. Direct exam of the colon should be repeated every 5-10 years through age 59, unless early forms of precancerous polyps or small growths are found.  People who are at an increased risk for hepatitis B should be screened for this virus. You are considered at high risk for hepatitis B  if:  You were born in a country where hepatitis B occurs often. Talk with your health care provider about which countries are considered high risk.  Your parents were born in a high-risk country and you have not received a shot to protect against hepatitis B (hepatitis B vaccine).  You have HIV or AIDS.  You use needles to inject street drugs.  You live with, or have sex with, someone who has hepatitis B.  You are a man who has sex with other men (MSM).  You get hemodialysis treatment.  You take certain medicines for conditions such as cancer, organ transplantation, and autoimmune conditions.  Hepatitis C blood testing is recommended for all people born from [birth year] through 1965 and any individual with known risks for hepatitis C.  Practice safe sex. Use condoms and avoid high-risk sexual practices to reduce the spread of sexually transmitted infections (STIs). STIs include gonorrhea, chlamydia, syphilis, trichomonas, herpes, HPV, and human  immunodeficiency virus (HIV). Herpes, HIV, and HPV are viral illnesses that have no cure. They can result in disability, cancer, and death.  If you are a man who has sex with other men, you should be screened at least once per year for:  HIV.  Urethral, rectal, and pharyngeal infection of gonorrhea, chlamydia, or both.  If you are at risk of being infected with HIV, it is recommended that you take a prescription medicine daily to prevent HIV infection. This is called preexposure prophylaxis (PrEP). You are considered at risk if:  You are a man who has sex with other men (MSM) and have other risk factors.  You are a heterosexual man, are sexually active, and are at increased risk for HIV infection.  You take drugs by injection.  You are sexually active with a partner who has HIV.  Talk with your health care provider about whether you are at high risk of being infected with HIV. If you choose to begin PrEP, you should first be tested for HIV. You  should then be tested every 3 months for as long as you are taking PrEP.  A one-time screening for abdominal aortic aneurysm (AAA) and surgical repair of large AAAs by ultrasound are recommended for men ages 52 to 48 years who are current or former smokers.  Healthy men should no longer receive prostate-specific antigen (PSA) blood tests as part of routine cancer screening. Talk with your health care provider about prostate cancer screening.  Testicular cancer screening is not recommended for adult males who have no symptoms. Screening includes self-exam, a health care provider exam, and other screening tests. Consult with your health care provider about any symptoms you have or any concerns you have about testicular cancer.  Use sunscreen. Apply sunscreen liberally and repeatedly throughout the day. You should seek shade when your shadow is shorter than you. Protect yourself by wearing long sleeves, pants, a wide-brimmed hat, and sunglasses year round, whenever you are outdoors.  Once a month, do a whole-body skin exam, using a mirror to look at the skin on your back. Tell your health care provider about new moles, moles that have irregular borders, moles that are larger than a pencil eraser, or moles that have changed in shape or color.  Stay current with required vaccines (immunizations).  Influenza vaccine. All adults should be immunized every year.  Tetanus, diphtheria, and acellular pertussis (Td, Tdap) vaccine. An adult who has not previously received Tdap or who does not know his vaccine status should receive 1 dose of Tdap. This initial dose should be followed by tetanus and diphtheria toxoids (Td) booster doses every 10 years. Adults with an unknown or incomplete history of completing a 3-dose immunization series with Td-containing vaccines should begin or complete a primary immunization series including a Tdap dose. Adults should receive a Td booster every 10 years.  Varicella vaccine.  An adult without evidence of immunity to varicella should receive 2 doses or a second dose if he has previously received 1 dose.  Human papillomavirus (HPV) vaccine. Males aged 11-21 years who have not received the vaccine previously should receive the 3-dose series. Males aged 22-26 years may be immunized. Immunization is recommended through the age of 64 years for any male who has sex with males and did not get any or all doses earlier. Immunization is recommended for any person with an immunocompromised condition through the age of 68 years if he did not get any or all doses earlier. During the  3-dose series, the second dose should be obtained 4-8 weeks after the first dose. The third dose should be obtained 24 weeks after the first dose and 16 weeks after the second dose.  Zoster vaccine. One dose is recommended for adults aged 63 years or older unless certain conditions are present.  Measles, mumps, and rubella (MMR) vaccine. Adults born before [birth year] generally are considered immune to measles and mumps. Adults born in [birth year] or later should have 1 or more doses of MMR vaccine unless there is a contraindication to the vaccine or there is laboratory evidence of immunity to each of the three diseases. A routine second dose of MMR vaccine should be obtained at least 28 days after the first dose for students attending postsecondary schools, health care workers, or international travelers. People who received inactivated measles vaccine or an unknown type of measles vaccine during 1963-1967 should receive 2 doses of MMR vaccine. People who received inactivated mumps vaccine or an unknown type of mumps vaccine before 1979 and are at high risk for mumps infection should consider immunization with 2 doses of MMR vaccine. Unvaccinated health care workers born before [birth year] who lack laboratory evidence of measles, mumps, or rubella immunity or laboratory confirmation of disease should consider measles and mumps  immunization with 2 doses of MMR vaccine or rubella immunization with 1 dose of MMR vaccine.  Pneumococcal 13-valent conjugate (PCV13) vaccine. When indicated, a person who is uncertain of his immunization history and has no record of immunization should receive the PCV13 vaccine. All adults 33 years of age and older should receive this vaccine. An adult aged 45 years or older who has certain medical conditions and has not been previously immunized should receive 1 dose of PCV13 vaccine. This PCV13 should be followed with a dose of pneumococcal polysaccharide (PPSV23) vaccine. Adults who are at high risk for pneumococcal disease should obtain the PPSV23 vaccine at least 8 weeks after the dose of PCV13 vaccine. Adults older than 32 years of age who have normal immune system function should obtain the PPSV23 vaccine dose at least 1 year after the dose of PCV13 vaccine.  Pneumococcal polysaccharide (PPSV23) vaccine. When PCV13 is also indicated, PCV13 should be obtained first. All adults aged 17 years and older should be immunized. An adult younger than age 8 years who has certain medical conditions should be immunized. Any person who resides in a nursing home or long-term care facility should be immunized. An adult smoker should be immunized. People with an immunocompromised condition and certain other conditions should receive both PCV13 and PPSV23 vaccines. People with human immunodeficiency virus (HIV) infection should be immunized as soon as possible after diagnosis. Immunization during chemotherapy or radiation therapy should be avoided. Routine use of PPSV23 vaccine is not recommended for American Indians, [place] Natives, or people younger than 65 years unless there are medical conditions that require PPSV23 vaccine. When indicated, people who have unknown immunization and have no record of immunization should receive PPSV23 vaccine. One-time revaccination 5 years after the first dose of PPSV23 is  recommended for people aged 19-64 years who have chronic kidney failure, nephrotic syndrome, asplenia, or immunocompromised conditions. People who received 1-2 doses of PPSV23 before age 70 years should receive another dose of PPSV23 vaccine at age 29 years or later if at least 5 years have passed since the previous dose. Doses of PPSV23 are not needed for people immunized with PPSV23 at or after age 38 years.  Meningococcal vaccine. Adults with asplenia or  persistent complement component deficiencies should receive 2 doses of quadrivalent meningococcal conjugate (MenACWY-D) vaccine. The doses should be obtained at least 2 months apart. Microbiologists working with certain meningococcal bacteria, [employer] recruits, people at risk during an outbreak, and people who travel to or live in countries with a high rate of meningitis should be immunized. A first-year college student up through age 33 years who is living in a residence hall should receive a dose if he did not receive a dose on or after his 16th birthday. Adults who have certain high-risk conditions should receive one or more doses of vaccine.  Hepatitis A vaccine. Adults who wish to be protected from this disease, have chronic liver disease, work with hepatitis A-infected animals, work in hepatitis A research labs, or travel to or work in countries with a high rate of hepatitis A should be immunized. Adults who were previously unvaccinated and who anticipate close contact with an international adoptee during the first 60 days after arrival in the [place] from a country with a high rate of hepatitis A should be immunized.  Hepatitis B vaccine. Adults should be immunized if they wish to be protected from this disease, are under age 90 years and have diabetes, have chronic liver disease, have had more than one sex partner in the past 6 months, may be exposed to blood or other infectious body fluids, are household contacts or sex partners of hepatitis  B positive people, are clients or workers in certain care facilities, or travel to or work in countries with a high rate of hepatitis B.  Haemophilus influenzae type b (Hib) vaccine. A previously unvaccinated person with asplenia or sickle cell disease or having a scheduled splenectomy should receive 1 dose of Hib vaccine. Regardless of previous immunization, a recipient of a hematopoietic stem cell transplant should receive a 3-dose series 6-12 months after his successful transplant. Hib vaccine is not recommended for adults with HIV infection. Preventive Service / Frequency Ages 21 to 30  Blood pressure check.** / Every 3-5 years.  Lipid and cholesterol check.** / Every 5 years beginning at age 61.  Hepatitis C blood test.** / For any individual with known risks for hepatitis C.  Skin self-exam. / Monthly.  Influenza vaccine. / Every year.  Tetanus, diphtheria, and acellular pertussis (Tdap, Td) vaccine.** / Consult your health care provider. 1 dose of Td every 10 years.  Varicella vaccine.** / Consult your health care provider.  HPV vaccine. / 3 doses over 6 months, if 52 or younger.  Measles, mumps, rubella (MMR) vaccine.** / You need at least 1 dose of MMR if you were born in 1957 or later. You may also need a second dose.  Pneumococcal 13-valent conjugate (PCV13) vaccine.** / Consult your health care provider.  Pneumococcal polysaccharide (PPSV23) vaccine.** / 1 to 2 doses if you smoke cigarettes or if you have certain conditions.  Meningococcal vaccine.** / 1 dose if you are age 39 to 90 years and a [employer] researcher living in a residence hall, or have one of several medical conditions. You may also need additional booster doses.  Hepatitis A vaccine.** / Consult your health care provider.  Hepatitis B vaccine.** / Consult your health care provider.  Haemophilus influenzae type b (Hib) vaccine.** / Consult your health care provider. Ages 22 to 48  Blood pressure  check.** / Every year.  Lipid and cholesterol check.** / Every 5 years beginning at age 68.  Lung cancer screening. / Every year if you are  aged 65-80 years and have a 30-pack-year history of smoking and currently smoke or have quit within the past 15 years. Yearly screening is stopped once you have quit smoking for at least 15 years or develop a health problem that would prevent you from having lung cancer treatment.  Fecal occult blood test (FOBT) of stool. / Every year beginning at age 25 and continuing until age 34. You may not have to do this test if you get a colonoscopy every 10 years.  Flexible sigmoidoscopy** or colonoscopy.** / Every 5 years for a flexible sigmoidoscopy or every 10 years for a colonoscopy beginning at age 56 and continuing until age 76.  Hepatitis C blood test.** / For all people born from [birth year] through 1965 and any individual with known risks for hepatitis C.  Skin self-exam. / Monthly.  Influenza vaccine. / Every year.  Tetanus, diphtheria, and acellular pertussis (Tdap/Td) vaccine.** / Consult your health care provider. 1 dose of Td every 10 years.  Varicella vaccine.** / Consult your health care provider.  Zoster vaccine.** / 1 dose for adults aged 5 years or older.  Measles, mumps, rubella (MMR) vaccine.** / You need at least 1 dose of MMR if you were born in 1959 or later. You may also need a second dose.  Pneumococcal 13-valent conjugate (PCV13) vaccine.** / Consult your health care provider.  Pneumococcal polysaccharide (PPSV23) vaccine.** / 1 to 2 doses if you smoke cigarettes or if you have certain conditions.  Meningococcal vaccine.** / Consult your health care provider.  Hepatitis A vaccine.** / Consult your health care provider.  Hepatitis B vaccine.** / Consult your health care provider.  Haemophilus influenzae type b (Hib) vaccine.** / Consult your health care provider. Ages 16 and over  Blood pressure check.** / Every year.  Lipid and  cholesterol check.**/ Every 5 years beginning at age 9.  Lung cancer screening. / Every year if you are aged 26-80 years and have a 30-pack-year history of smoking and currently smoke or have quit within the past 15 years. Yearly screening is stopped once you have quit smoking for at least 15 years or develop a health problem that would prevent you from having lung cancer treatment.  Fecal occult blood test (FOBT) of stool. / Every year beginning at age 71 and continuing until age 12. You may not have to do this test if you get a colonoscopy every 10 years.  Flexible sigmoidoscopy** or colonoscopy.** / Every 5 years for a flexible sigmoidoscopy or every 10 years for a colonoscopy beginning at age 57 and continuing until age 82.  Hepatitis C blood test.** / For all people born from [birth year] through 1967 and any individual with known risks for hepatitis C.  Abdominal aortic aneurysm (AAA) screening.** / A one-time screening for ages 95 to 40 years who are current or former smokers.  Skin self-exam. / Monthly.  Influenza vaccine. / Every year.  Tetanus, diphtheria, and acellular pertussis (Tdap/Td) vaccine.** / 1 dose of Td every 10 years.  Varicella vaccine.** / Consult your health care provider.  Zoster vaccine.** / 1 dose for adults aged 34 years or older.  Pneumococcal 13-valent conjugate (PCV13) vaccine.** / 1 dose for all adults aged 66 years and older.  Pneumococcal polysaccharide (PPSV23) vaccine.** / 1 dose for all adults aged 33 years and older.  Meningococcal vaccine.** / Consult your health care provider.  Hepatitis A vaccine.** / Consult your health care provider.  Hepatitis B vaccine.** / Consult your health care provider.  Haemophilus influenzae type b (Hib) vaccine.** / Consult your health care provider. **Family history and personal history of risk and conditions may change your health care provider's recommendations.   This information is not intended to replace advice  given to you by your health care provider. Make sure you discuss any questions you have with your health care provider.   Document Released: 04/25/2001 Document Revised: 03/20/2014 Document Reviewed: 07/25/2010 Elsevier Interactive Patient Education [employer].

## 2015-11-30 NOTE — Progress Notes (Signed)
Patient presents to clinic today for annual exam.  Patient is fasting for labs. Body mass index is 27.84 kg/m. Does stay very active. Endorses well-balanced diet overall.  Chronic Issues: ADHD -- Is currently on Adderall 10 mg once daily but only takes on days when he is working on project deadlines. Denies side effects with medication. Is overdue for UDS.   Insomnia -- Doing well overall but can cary depending on stressors. Is currently on a regimen of Ambien 10 mg, taking only as needed. Averages about 1-2 x week.    Health Maintenance: Immunizations -- Will be getting flu shot at work. Tetanus up-to-date. HIV Screening -- completed. Negative.  Past Medical History:  Diagnosis Date  . ADHD (attention deficit hyperactivity disorder)    Mild    Past Surgical History:  Procedure Laterality Date  . NO PAST SURGERIES      Current Outpatient Prescriptions on File Prior to Visit  Medication Sig Dispense Refill  . Multiple Vitamin (MULTIVITAMIN) tablet Take 1 tablet by mouth daily.    . Omega-3 Fatty Acids (FISH OIL CONCENTRATE PO) Take 1 capsule by mouth daily.     No current facility-administered medications on file prior to visit.     No Known Allergies  Family History  Problem Relation Age of Onset  . Multiple sclerosis Mother   . Cancer Father [phone]    Prostate  . Hyperlipidemia Father     Social History   Social History  . Marital status: Single    Spouse name: N/A  . Number of children: 1  . Years of education: N/A   Occupational History  . Engineering    Social History Main Topics  . Smoking status: Never Smoker  . Smokeless tobacco: Never Used  . Alcohol use 0.0 oz/week  . Drug use: No  . Sexual activity: Yes   Other Topics Concern  . Not on file   Social History Narrative  . No narrative on file   Review of Systems  Constitutional: Negative for fever and weight loss.  HENT: Negative for ear discharge, ear pain, hearing loss and tinnitus.     Eyes: Negative for blurred vision, double vision, photophobia and pain.  Respiratory: Negative for cough and shortness of breath.   Cardiovascular: Negative for chest pain and palpitations.  Gastrointestinal: Negative for abdominal pain, blood in stool, constipation, diarrhea, heartburn, melena, nausea and vomiting.  Genitourinary: Negative for dysuria, flank pain, frequency, hematuria and urgency.  Musculoskeletal: Negative for falls.  Neurological: Negative for dizziness, loss of consciousness and headaches.  Endo/Heme/Allergies: Negative for environmental allergies.  Psychiatric/Behavioral: Negative for depression, hallucinations, substance abuse and suicidal ideas. The patient is not nervous/anxious and does not have insomnia.    BP 119/76 (BP Location: Left Arm, Patient Position: Sitting, Cuff Size: Normal)   Pulse 76   Temp 98.3 F (36.8 C) (Oral)   Resp 16   Ht 6' (1.829 m)   Wt 205 lb 4 oz (93.1 kg)   SpO2 98%   BMI 27.84 kg/m   Physical Exam  Constitutional: He is oriented to person, place, and time and well-developed, well-nourished, and in no distress.  HENT:  Head: Normocephalic and atraumatic.  Right Ear: External ear normal.  Left Ear: External ear normal.  Nose: Nose normal.  Mouth/Throat: Oropharynx is clear and moist. No oropharyngeal exudate.  Eyes: Conjunctivae and EOM are normal. Pupils are equal, round, and reactive to light.  Neck: Neck supple. No thyromegaly present.  Cardiovascular: Normal  rate, regular rhythm, normal heart sounds and intact distal pulses.   Pulmonary/Chest: Effort normal and breath sounds normal. No respiratory distress. He has no wheezes. He has no rales. He exhibits no tenderness.  Abdominal: Soft. Bowel sounds are normal. He exhibits no distension and no mass. There is no tenderness. There is no rebound and no guarding.  Genitourinary: Testes/scrotum normal and penis normal. No discharge found.  Lymphadenopathy:    He has no cervical  adenopathy.  Neurological: He is alert and oriented to person, place, and time.  Skin: Skin is warm and dry. No rash noted.  Psychiatric: Affect normal.  Vitals reviewed.  Assessment/Plan: Visit for preventive health examination Depression screen negative. Health Maintenance reviewed -- Will be getting flu shot at work on 12/15/15. Preventive schedule discussed and handout given in AVS. Will obtain fasting labs today.     Piedad Climes, PA-C

## 2015-12-15 DIAGNOSIS — Z23 Encounter for immunization: Secondary | ICD-10-CM | POA: Diagnosis not present

## 2016-02-11 ENCOUNTER — Telehealth: Payer: Self-pay

## 2016-02-11 NOTE — Telephone Encounter (Signed)
I found a Rx for Adderall that was printed on June 16th 2017 that was not picked up and the Rx has been shredded.

## 2016-04-17 DIAGNOSIS — R197 Diarrhea, unspecified: Secondary | ICD-10-CM | POA: Diagnosis not present

## 2016-04-17 DIAGNOSIS — R112 Nausea with vomiting, unspecified: Secondary | ICD-10-CM | POA: Diagnosis not present

## 2016-04-17 DIAGNOSIS — K529 Noninfective gastroenteritis and colitis, unspecified: Secondary | ICD-10-CM | POA: Diagnosis not present

## 2016-04-17 DIAGNOSIS — R6883 Chills (without fever): Secondary | ICD-10-CM | POA: Diagnosis not present

## 2016-08-01 DIAGNOSIS — Z713 Dietary counseling and surveillance: Secondary | ICD-10-CM | POA: Diagnosis not present

## 2016-12-06 NOTE — Progress Notes (Signed)
Patient presents to clinic today for annual exam.  Patient is fasting for labs. Diet -- Well-balanced overall. Drinks mainly water.  Exercise -- Combination of cardio and resistance training.  Chronic Issues: ADHD -- Previously on Adderall 10 mg. Uses only when focus gets out of control -- urgent deadlines, presentations, etc. Has been on medication for quite some time without side effect and with good relief.   Insomnia -- Previously on Ambien 10 mg QHS PRN. Uses about once a week and does very well with this regimen.Marland Kitchen  Health Maintenance: Immunizations -- Tetanus up-to-date. Declines flu shot today - gets at work  Past Medical History:  Diagnosis Date  . ADHD (attention deficit hyperactivity disorder)    Mild    Past Surgical History:  Procedure Laterality Date  . NO PAST SURGERIES      Current Outpatient Prescriptions on File Prior to Visit  Medication Sig Dispense Refill  . Multiple Vitamin (MULTIVITAMIN) tablet Take 1 tablet by mouth daily.    . Omega-3 Fatty Acids (FISH OIL CONCENTRATE PO) Take 1 capsule by mouth daily.     No current facility-administered medications on file prior to visit.     No Known Allergies  Family History  Problem Relation Age of Onset  . Multiple sclerosis Mother   . Cancer Father 57       Prostate  . Hyperlipidemia Father     Social History   Social History  . Marital status: Single    Spouse name: N/A  . Number of children: 1  . Years of education: N/A   Occupational History  . Engineering    Social History Main Topics  . Smoking status: Never Smoker  . Smokeless tobacco: Never Used  . Alcohol use 0.0 oz/week  . Drug use: No  . Sexual activity: Yes   Other Topics Concern  . Not on file   Social History Narrative  . No narrative on file   Review of Systems  Constitutional: Negative for fever and weight loss.  HENT: Negative for ear discharge, ear pain, hearing loss and tinnitus.   Eyes: Negative for blurred  vision, double vision, photophobia and pain.  Respiratory: Negative for cough and shortness of breath.   Cardiovascular: Negative for chest pain and palpitations.  Gastrointestinal: Negative for abdominal pain, blood in stool, constipation, diarrhea, heartburn, melena, nausea and vomiting.  Genitourinary: Negative for dysuria, flank pain, frequency, hematuria and urgency.  Musculoskeletal: Negative for falls.  Neurological: Negative for dizziness, loss of consciousness and headaches.  Endo/Heme/Allergies: Negative for environmental allergies.  Psychiatric/Behavioral: Negative for depression, hallucinations, substance abuse and suicidal ideas. The patient is not nervous/anxious and does not have insomnia.    BP 118/70   Pulse 69   Temp 98.4 F (36.9 C) (Oral)   Resp 14   Ht 6' (1.829 m)   Wt 205 lb (93 kg)   SpO2 99%   BMI 27.80 kg/m   Physical Exam  Constitutional: He is oriented to person, place, and time and well-developed, well-nourished, and in no distress.  HENT:  Head: Normocephalic and atraumatic.  Right Ear: External ear normal.  Left Ear: External ear normal.  Nose: Nose normal.  Mouth/Throat: Oropharynx is clear and moist. No oropharyngeal exudate.  Eyes: Pupils are equal, round, and reactive to light. Conjunctivae and EOM are normal.  Neck: Neck supple. No thyromegaly present.  Cardiovascular: Normal rate, regular rhythm, normal heart sounds and intact distal pulses.   Pulmonary/Chest: Effort normal and breath sounds  normal. No respiratory distress. He has no wheezes. He has no rales. He exhibits no tenderness.  Abdominal: Soft. Bowel sounds are normal. He exhibits no distension and no mass. There is no tenderness. There is no rebound and no guarding.  Genitourinary: Testes/scrotum normal and penis normal. No discharge found.  Lymphadenopathy:    He has no cervical adenopathy.  Neurological: He is alert and oriented to person, place, and time.  Skin: Skin is warm and  dry. No rash noted.  Psychiatric: Affect normal.  Vitals reviewed.  Assessment/Plan: Visit for preventive health examination Depression screen negative. Health Maintenance reviewed -- TDaP updated today. Gets flu shot at work. Preventive schedule discussed and handout given in AVS. Will obtain fasting labs today.   Attention deficit disorder Medications refilled. PRN use.     Piedad Climes, PA-C

## 2016-12-07 ENCOUNTER — Encounter: Payer: Self-pay | Admitting: Physician Assistant

## 2016-12-07 ENCOUNTER — Ambulatory Visit (INDEPENDENT_AMBULATORY_CARE_PROVIDER_SITE_OTHER): Payer: BLUE CROSS/BLUE SHIELD | Admitting: Physician Assistant

## 2016-12-07 VITALS — BP 118/70 | HR 69 | Temp 98.4°F | Resp 14 | Ht 72.0 in | Wt 205.0 lb

## 2016-12-07 DIAGNOSIS — Z0000 Encounter for general adult medical examination without abnormal findings: Secondary | ICD-10-CM

## 2016-12-07 DIAGNOSIS — Z23 Encounter for immunization: Secondary | ICD-10-CM

## 2016-12-07 DIAGNOSIS — F988 Other specified behavioral and emotional disorders with onset usually occurring in childhood and adolescence: Secondary | ICD-10-CM

## 2016-12-07 LAB — COMPREHENSIVE METABOLIC PANEL
ALT: 44 U/L (ref 0–53)
AST: 22 U/L (ref 0–37)
Albumin: 4.8 g/dL (ref 3.5–5.2)
Alkaline Phosphatase: 80 U/L (ref 39–117)
BUN: 17 mg/dL (ref 6–23)
CHLORIDE: 101 meq/L (ref 96–112)
CO2: 33 meq/L — AB (ref 19–32)
CREATININE: 1.05 mg/dL (ref 0.40–1.50)
Calcium: 9.7 mg/dL (ref 8.4–10.5)
GFR: 86.32 mL/min (ref >60.00)
Glucose, Bld: 90 mg/dL (ref 70–99)
Potassium: 4.5 mEq/L (ref 3.5–5.1)
SODIUM: 138 meq/L (ref 135–145)
Total Bilirubin: 0.5 mg/dL (ref 0.2–1.2)
Total Protein: 7.1 g/dL (ref 6.0–8.3)

## 2016-12-07 LAB — CBC WITH DIFFERENTIAL/PLATELET
BASOS PCT: 0.6 % (ref 0.0–3.0)
Basophils Absolute: 0 10*3/uL (ref 0.0–0.1)
EOS ABS: 0.2 10*3/uL (ref 0.0–0.7)
Eosinophils Relative: 3.5 % (ref 0.0–5.0)
HCT: 47.1 % (ref 39.0–52.0)
Hemoglobin: 15.6 g/dL (ref 13.0–17.0)
Lymphocytes Relative: 30.2 % (ref 12.0–46.0)
Lymphs Abs: 1.5 10*3/uL (ref 0.7–4.0)
MCHC: 33.1 g/dL (ref 30.0–36.0)
MCV: 89.7 fl (ref 78.0–100.0)
Monocytes Absolute: 0.5 10*3/uL (ref 0.1–1.0)
Monocytes Relative: 9.7 % (ref 3.0–12.0)
NEUTROS ABS: 2.8 10*3/uL (ref 1.4–7.7)
Neutrophils Relative %: 56 % (ref 43.0–77.0)
PLATELETS: 231 10*3/uL (ref 150.0–400.0)
RBC: 5.26 Mil/uL (ref 4.22–5.81)
RDW: 13.4 % (ref 11.5–15.5)
WBC: 4.9 10*3/uL (ref 4.0–10.5)

## 2016-12-07 LAB — LIPID PANEL
Cholesterol: 228 mg/dL — ABNORMAL HIGH (ref 0–200)
HDL: 45.4 mg/dL (ref >39.00)
LDL CALC: 157 mg/dL — AB (ref 0–99)
NonHDL: 182.26
Total CHOL/HDL Ratio: 5
Triglycerides: 127 mg/dL (ref 0.0–149.0)
VLDL: 25.4 mg/dL (ref 0.0–40.0)

## 2016-12-07 MED ORDER — AMPHETAMINE-DEXTROAMPHETAMINE 10 MG PO TABS: 10.0000 mg | ORAL_TABLET | Freq: Every day | ORAL | 0 refills | Status: DC

## 2016-12-07 MED ORDER — ZOLPIDEM TARTRATE 10 MG PO TABS: 10.0000 mg | ORAL_TABLET | Freq: Every evening | ORAL | 2 refills | Status: DC | PRN

## 2016-12-07 NOTE — Assessment & Plan Note (Signed)
Medications refilled. PRN use.

## 2016-12-07 NOTE — Addendum Note (Signed)
Addended by: Eulah Pont on: 12/07/2016 09:02 AM   Modules accepted: Orders

## 2016-12-07 NOTE — Assessment & Plan Note (Signed)
Depression screen negative. Health Maintenance reviewed -- TDaP updated today. Gets flu shot at work. Preventive schedule discussed and handout given in AVS. Will obtain fasting labs today.

## 2016-12-07 NOTE — Patient Instructions (Signed)
Please go to the lab for blood work.   Our office will call you with your results unless you have chosen to receive results via MyChart.  If your blood work is normal we will follow-up each year for physicals and as scheduled for chronic medical problems.  If anything is abnormal we will treat accordingly and get you in for a follow-up.  Please resume medications as directed. Consider getting some Pure ZZZs to try instead of the melatonin for nights that you are not taking the Ambien.    Preventive Care 18-39 Years, Male Preventive care refers to lifestyle choices and visits with your health care provider that can promote health and wellness. What does preventive care include?  A yearly physical exam. This is also called an annual well check.  Dental exams once or twice a year.  Routine eye exams. Ask your health care provider how often you should have your eyes checked.  Personal lifestyle choices, including: ? Daily care of your teeth and gums. ? Regular physical activity. ? Eating a healthy diet. ? Avoiding tobacco and drug use. ? Limiting alcohol use. ? Practicing safe sex. What happens during an annual well check? The services and screenings done by your health care provider during your annual well check will depend on your age, overall health, lifestyle risk factors, and family history of disease. Counseling Your health care provider may ask you questions about your:  Alcohol use.  Tobacco use.  Drug use.  Emotional well-being.  Home and relationship well-being.  Sexual activity.  Eating habits.  Work and work Statistician.  Screening You may have the following tests or measurements:  Height, weight, and BMI.  Blood pressure.  Lipid and cholesterol levels. These may be checked every 5 years starting at age 40.  Diabetes screening. This is done by checking your blood sugar (glucose) after you have not eaten for a while (fasting).  Skin  check.  Hepatitis C blood test.  Hepatitis B blood test.  Sexually transmitted disease (STD) testing.  Discuss your test results, treatment options, and if necessary, the need for more tests with your health care provider. Vaccines Your health care provider may recommend certain vaccines, such as:  Influenza vaccine. This is recommended every year.  Tetanus, diphtheria, and acellular pertussis (Tdap, Td) vaccine. You may need a Td booster every 10 years.  Varicella vaccine. You may need this if you have not been vaccinated.  HPV vaccine. If you are 63 or younger, you may need three doses over 6 months.  Measles, mumps, and rubella (MMR) vaccine. You may need at least one dose of MMR.You may also need a second dose.  Pneumococcal 13-valent conjugate (PCV13) vaccine. You may need this if you have certain conditions and have not been vaccinated.  Pneumococcal polysaccharide (PPSV23) vaccine. You may need one or two doses if you smoke cigarettes or if you have certain conditions.  Meningococcal vaccine. One dose is recommended if you are age 57-21 years and a first-year college student living in a residence hall, or if you have one of several medical conditions. You may also need additional booster doses.  Hepatitis A vaccine. You may need this if you have certain conditions or if you travel or work in places where you may be exposed to hepatitis A.  Hepatitis B vaccine. You may need this if you have certain conditions or if you travel or work in places where you may be exposed to hepatitis B.  Haemophilus influenzae type b (  Hib) vaccine. You may need this if you have certain risk factors.  Talk to your health care provider about which screenings and vaccines you need and how often you need them. This information is not intended to replace advice given to you by your health care provider. Make sure you discuss any questions you have with your health care provider. Document Released:  04/25/2001 Document Revised: 11/17/2015 Document Reviewed: 12/29/2014 Elsevier Interactive Patient Education  2017 [employer].

## 2016-12-07 NOTE — Progress Notes (Signed)
Pre visit review using our clinic review tool, if applicable. No additional management support is needed unless otherwise documented below in the visit note. 

## 2016-12-26 DIAGNOSIS — Z23 Encounter for immunization: Secondary | ICD-10-CM | POA: Diagnosis not present

## 2017-02-21 ENCOUNTER — Other Ambulatory Visit: Payer: Self-pay | Admitting: Emergency Medicine

## 2017-02-21 ENCOUNTER — Other Ambulatory Visit: Payer: Self-pay | Admitting: Physician Assistant

## 2017-02-21 ENCOUNTER — Encounter: Payer: Self-pay | Admitting: Physician Assistant

## 2017-02-21 DIAGNOSIS — G4700 Insomnia, unspecified: Secondary | ICD-10-CM

## 2017-02-21 DIAGNOSIS — F988 Other specified behavioral and emotional disorders with onset usually occurring in childhood and adolescence: Secondary | ICD-10-CM

## 2017-02-21 MED ORDER — AMPHETAMINE-DEXTROAMPHETAMINE 10 MG PO TABS: 10.0000 mg | ORAL_TABLET | Freq: Every day | ORAL | 0 refills | Status: DC

## 2017-02-21 MED ORDER — ZOLPIDEM TARTRATE 10 MG PO TABS: 10.0000 mg | ORAL_TABLET | Freq: Every evening | ORAL | 2 refills | Status: DC | PRN

## 2017-02-21 NOTE — Telephone Encounter (Signed)
LMOVM advising patient rx for Adderall is ready for pick up at the front desk. Rx for Ambien was faxed to the pharmacy.

## 2017-05-08 ENCOUNTER — Encounter: Payer: Self-pay | Admitting: Emergency Medicine

## 2017-05-13 ENCOUNTER — Emergency Department (HOSPITAL_COMMUNITY): Payer: BLUE CROSS/BLUE SHIELD

## 2017-05-13 ENCOUNTER — Emergency Department (HOSPITAL_COMMUNITY)
Admission: EM | Admit: 2017-05-13 | Discharge: 2017-05-14 | Disposition: A | Discharge status: Home / Self Care | Payer: BLUE CROSS/BLUE SHIELD | Attending: Emergency Medicine | Admitting: Emergency Medicine

## 2017-05-13 ENCOUNTER — Other Ambulatory Visit: Payer: Self-pay

## 2017-05-13 ENCOUNTER — Encounter (HOSPITAL_COMMUNITY): Payer: Self-pay | Admitting: Emergency Medicine

## 2017-05-13 DIAGNOSIS — M549 Dorsalgia, unspecified: Secondary | ICD-10-CM | POA: Diagnosis not present

## 2017-05-13 DIAGNOSIS — R0602 Shortness of breath: Secondary | ICD-10-CM | POA: Diagnosis not present

## 2017-05-13 DIAGNOSIS — R200 Anesthesia of skin: Secondary | ICD-10-CM | POA: Diagnosis not present

## 2017-05-13 DIAGNOSIS — Z79899 Other long term (current) drug therapy: Secondary | ICD-10-CM | POA: Insufficient documentation

## 2017-05-13 DIAGNOSIS — R079 Chest pain, unspecified: Secondary | ICD-10-CM | POA: Diagnosis not present

## 2017-05-13 LAB — BASIC METABOLIC PANEL
Anion gap: 15 (ref 5–15)
BUN: 13 mg/dL (ref 6–20)
CALCIUM: 9.4 mg/dL (ref 8.9–10.3)
CO2: 24 mmol/L (ref 22–32)
Chloride: 101 mmol/L (ref 101–111)
Creatinine, Ser: 1.09 mg/dL (ref 0.61–1.24)
GFR calc Af Amer: >60 mL/min (ref >60)
GLUCOSE: 91 mg/dL (ref 65–99)
POTASSIUM: 3.7 mmol/L (ref 3.5–5.1)
Sodium: 140 mmol/L (ref 135–145)

## 2017-05-13 LAB — I-STAT TROPONIN, ED: Troponin i, poc: 0 ng/mL (ref 0.00–0.08)

## 2017-05-13 LAB — CBC
HEMATOCRIT: 45 % (ref 39.0–52.0)
Hemoglobin: 15.2 g/dL (ref 13.0–17.0)
MCH: 30.2 pg (ref 26.0–34.0)
MCHC: 33.8 g/dL (ref 30.0–36.0)
MCV: 89.5 fL (ref 78.0–100.0)
Platelets: 233 10*3/uL (ref 150–400)
RBC: 5.03 MIL/uL (ref 4.22–5.81)
RDW: 13.4 % (ref 11.5–15.5)
WBC: 6.5 10*3/uL (ref 4.0–10.5)

## 2017-05-13 MED ORDER — MORPHINE SULFATE (PF) 4 MG/ML IV SOLN
4.0000 mg | Freq: Once | INTRAVENOUS | Status: AC
  Administered 2017-05-13: 4 mg via INTRAVENOUS
  Filled 2017-05-13: qty 1

## 2017-05-13 MED ORDER — IOPAMIDOL (ISOVUE-370) INJECTION 76%
INTRAVENOUS | Status: AC
  Administered 2017-05-13: 100 mL
  Filled 2017-05-13: qty 100

## 2017-05-13 NOTE — ED Triage Notes (Signed)
Pt c/o 4/10 left cp radiating to left arm with numbness on his left arm with some SOB. No nausea, vomiting or dizziness.

## 2017-05-13 NOTE — ED Provider Notes (Signed)
[facility]   CSN: [id number] Arrival date & time: 05/13/17  2150     History   Chief Complaint Chief Complaint  Patient presents with  . Chest Pain    HPI Victor Martinez is a 32 y.o. male.  Patient presents to the ED with a chief complaint of back pain, chest pain, and left arm numbness.  He states that the symptoms started this morning.  He reports that the pain radiates from his back to his chest.  He states that the pain was present when he woke up and then progressed to radiating into his chest.  He states that he then developed left arm numbness/tingling, but without weakness.  He denies HA or neck pain.  He has tried taking advil with no relief.  He states that he went to the pharmacy and took his BP and it was 170/110.  He rates his symptoms as moderate.  He doesn't smoke or use drugs.  He denies recent travel or surgery or immobilization.  He denies family hx of early heart disease.   The history is provided by the patient. No language interpreter was used.    Past Medical History:  Diagnosis Date  . ADHD (attention deficit hyperactivity disorder)    Mild    Patient Active Problem List   Diagnosis Date Noted  . Visit for preventive health examination 10/20/2014  . Family hx of prostate cancer 10/20/2014  . BMI 27.0-27.9,adult 10/20/2014  . Attention deficit disorder 02/06/2007    Past Surgical History:  Procedure Laterality Date  . NO PAST SURGERIES         Home Medications    Prior to Admission medications   Medication Sig Start Date End Date Taking? Authorizing Provider  amphetamine-dextroamphetamine (ADDERALL) 10 MG tablet Take 1 tablet (10 mg total) by mouth daily with breakfast. 02/21/17   Waldon MerlMartin, William C, PA-C  Multiple Vitamin (MULTIVITAMIN) tablet Take 1 tablet by mouth daily.    [provider]  Omega-3 Fatty Acids (FISH OIL CONCENTRATE PO) Take 1 capsule by mouth daily.    [provider]  zolpidem (AMBIEN) 10 MG tablet Take 1 tablet (10 mg total) by mouth at bedtime as needed for sleep. 02/21/17 01/22/19  Waldon MerlMartin, William C, PA-C    Family History Family History  Problem Relation Age of Onset  . Multiple sclerosis Mother   . Cancer Father 255       Prostate  . Hyperlipidemia Father     Social History Social History   Tobacco Use  . Smoking status: Never Smoker  . Smokeless tobacco: Never Used  Substance Use Topics  . Alcohol use: Yes    Alcohol/week: 0.0 oz  . Drug use: No     Allergies   Patient has no known allergies.   Review of Systems Review of Systems  Constitutional: Negative for chills and fever.  HENT: Negative for ear pain and sore throat.   Eyes: Negative for pain and visual disturbance.  Respiratory: Positive for shortness of breath. Negative for cough.   Cardiovascular: Positive for chest pain. Negative for palpitations.  Gastrointestinal: Negative for abdominal pain and vomiting.  Genitourinary: Negative for dysuria and hematuria.  Musculoskeletal: Negative for arthralgias and back pain.  Skin: Negative for color change and rash.  Neurological: Positive for numbness. Negative for seizures and syncope.  All other systems reviewed and are negative.    Physical Exam Updated Vital Signs BP (!) 148/95 (BP Location: Right  Arm)   Pulse 60   Temp 97.7 F (36.5 C) (Oral)   Resp 18   Ht 6' (1.829 m)   Wt 90.7 kg (200 lb)   SpO2 100%   BMI 27.12 kg/m   Physical Exam  Constitutional: He is oriented to person, place, and time. He appears well-developed and well-nourished.  HENT:  Head: Normocephalic and atraumatic.  Eyes: Conjunctivae and EOM are normal. Pupils are equal, round, and reactive to light. Right eye exhibits no discharge. Left eye exhibits no discharge. No scleral icterus.  Neck: Normal range of motion. Neck supple. No JVD present.  Cardiovascular: Normal rate, regular rhythm and normal heart sounds. Exam  reveals no gallop and no friction rub.  No murmur heard. Pulmonary/Chest: Effort normal and breath sounds normal. No respiratory distress. He has no wheezes. He has no rales. He exhibits no tenderness.  Abdominal: Soft. He exhibits no distension and no mass. There is no tenderness. There is no rebound and no guarding.  Musculoskeletal: Normal range of motion. He exhibits no edema or tenderness.  Neurological: He is alert and oriented to person, place, and time.  Sensation and strength intact throughout No deficits noted in LUE  Skin: Skin is warm and dry.  Psychiatric: He has a normal mood and affect. His behavior is normal. Judgment and thought content normal.  Nursing note and vitals reviewed.    ED Treatments / Results  Labs (all labs ordered are listed, but only abnormal results are displayed) Labs Reviewed  BASIC METABOLIC PANEL  CBC  I-STAT TROPONIN, ED    EKG  EKG Interpretation  Date/Time:  Sunday May 13 2017 21:52:50 EST Ventricular Rate:  63 PR Interval:  152 QRS Duration: 102 QT Interval:  388 QTC Calculation: 397 R Axis:   85 Text Interpretation:  Normal sinus rhythm with sinus arrhythmia Nonspecific T wave abnormality No previous tracing Confirmed by Cathren Laine ([id number]) on 05/13/2017 10:56:25 PM       Radiology Dg Chest 2 View  Result Date: 05/13/2017 CLINICAL DATA:  Chest pain EXAM: CHEST  2 VIEW COMPARISON:  None. FINDINGS: The heart size and mediastinal contours are within normal limits. Both lungs are clear. The visualized skeletal structures are unremarkable. IMPRESSION: No active cardiopulmonary disease. Electronically Signed   By: Deatra Robinson M.D.   On: 05/13/2017 22:46    Procedures Procedures (including critical care time)  Medications Ordered in ED Medications - No data to display   Initial Impression / Assessment and Plan / ED Course  I have reviewed the triage vital signs and the nursing notes.  Pertinent labs & imaging results that  were available during my care of the patient were reviewed by me and considered in my medical decision making (see chart for details).    Patient with chest pain and back pain with left arm numbness.  Onset this morning.  No hx of the same.  No traumatic injuries.  Has had associated SOB and was hypertensive to 170/110 earlier, without hx of hypertension.    PERC negative, doubt PE.  Low risk for ACS, HEART score is 1.    Given story of chest pain, back pain, arm numbness, and hypertension, feel that dissection study is indicated.  Discussed with Dr. Patria Mane, who agrees with the plan.  Discussed with patient who agrees with the plan.  CT scan shows no evidence of aneurysm or dissection, there is a left mucus impaction of the left bronchi.  I attribute the patient's symptoms to this.  We will treat him with azithromycin for atypical infection.  We discussed that he may need to follow-up with pulmonology if his symptoms do not resolve for further workup and testing.  Upon further questioning, the patient states that he did have a cough and cold about a week ago, but has felt better.    Final Clinical Impressions(s) / ED Diagnoses   Final diagnoses:  Chest pain, unspecified type  SOB (shortness of breath)    ED Discharge Orders        Ordered    azithromycin (ZITHROMAX) 250 MG tablet  Daily     05/14/17 0124       Roxy Horseman, PA-C 05/14/17 0132    Cathren Laine, MD 05/15/17 1334

## 2017-05-14 DIAGNOSIS — R0602 Shortness of breath: Secondary | ICD-10-CM | POA: Diagnosis not present

## 2017-05-14 MED ORDER — AZITHROMYCIN 250 MG PO TABS: 250.0000 mg | ORAL_TABLET | Freq: Every day | ORAL | 0 refills | Status: DC

## 2017-05-14 MED ORDER — MORPHINE SULFATE (PF) 4 MG/ML IV SOLN
4.0000 mg | Freq: Once | INTRAVENOUS | Status: AC
  Administered 2017-05-14: 4 mg via INTRAVENOUS
  Filled 2017-05-14: qty 1

## 2017-05-14 MED ORDER — IPRATROPIUM-ALBUTEROL 0.5-2.5 (3) MG/3ML IN SOLN
3.0000 mL | Freq: Once | RESPIRATORY_TRACT | Status: AC
  Administered 2017-05-14: 3 mL via RESPIRATORY_TRACT
  Filled 2017-05-14: qty 3

## 2017-05-14 NOTE — ED Notes (Signed)
Rob, [place]PA notified of pt requesting more pain medication.

## 2017-05-14 NOTE — ED Notes (Signed)
Rob, PA at bedside. 

## 2017-05-14 NOTE — Discharge Instructions (Signed)
You have mucus impaction in your left bronchi.  This can be a sign of infection.  You have been given antibiotics for this.  Please take them as directed.  If you do not feel improved in 5-7 days, please follow-up with the pulmonologist.

## 2017-05-25 ENCOUNTER — Encounter: Payer: Self-pay | Admitting: Physician Assistant

## 2017-05-25 ENCOUNTER — Ambulatory Visit (INDEPENDENT_AMBULATORY_CARE_PROVIDER_SITE_OTHER): Payer: BLUE CROSS/BLUE SHIELD | Admitting: Physician Assistant

## 2017-05-25 VITALS — BP 100/70 | HR 91 | Temp 98.4°F | Resp 16 | Ht 72.0 in | Wt 206.2 lb

## 2017-05-25 DIAGNOSIS — K2900 Acute gastritis without bleeding: Secondary | ICD-10-CM | POA: Diagnosis not present

## 2017-05-25 DIAGNOSIS — R9389 Abnormal findings on diagnostic imaging of other specified body structures: Secondary | ICD-10-CM

## 2017-05-25 LAB — CBC WITH DIFFERENTIAL/PLATELET
BASOS PCT: 0.3 % (ref 0.0–3.0)
Basophils Absolute: 0 10*3/uL (ref 0.0–0.1)
EOS PCT: 1.4 % (ref 0.0–5.0)
Eosinophils Absolute: 0.1 10*3/uL (ref 0.0–0.7)
HEMATOCRIT: 47.7 % (ref 39.0–52.0)
Hemoglobin: 16.4 g/dL (ref 13.0–17.0)
Lymphs Abs: 0.5 10*3/uL — ABNORMAL LOW (ref 0.7–4.0)
MCHC: 34.4 g/dL (ref 30.0–36.0)
MCV: 87.5 fl (ref 78.0–100.0)
MONOS PCT: 5.9 % (ref 3.0–12.0)
Monocytes Absolute: 0.3 10*3/uL (ref 0.1–1.0)
NEUTROS ABS: 5 10*3/uL (ref 1.4–7.7)
Neutrophils Relative %: 84.6 % — ABNORMAL HIGH (ref 43.0–77.0)
PLATELETS: 218 10*3/uL (ref 150.0–400.0)
RBC: 5.45 Mil/uL (ref 4.22–5.81)
RDW: 13.2 % (ref 11.5–15.5)
WBC: 5.9 10*3/uL (ref 4.0–10.5)

## 2017-05-25 LAB — LIPASE: Lipase: 20 U/L (ref 11.0–59.0)

## 2017-05-25 LAB — COMPREHENSIVE METABOLIC PANEL
ALT: 23 U/L (ref 0–53)
AST: 15 U/L (ref 0–37)
Albumin: 4.7 g/dL (ref 3.5–5.2)
Alkaline Phosphatase: 83 U/L (ref 39–117)
BUN: 16 mg/dL (ref 6–23)
CALCIUM: 9.4 mg/dL (ref 8.4–10.5)
CHLORIDE: 100 meq/L (ref 96–112)
CO2: 32 meq/L (ref 19–32)
Creatinine, Ser: 1.2 mg/dL (ref 0.40–1.50)
GFR: 73.79 mL/min (ref >60.00)
Glucose, Bld: 114 mg/dL — ABNORMAL HIGH (ref 70–99)
Potassium: 3.8 mEq/L (ref 3.5–5.1)
Sodium: 138 mEq/L (ref 135–145)
Total Bilirubin: 0.5 mg/dL (ref 0.2–1.2)
Total Protein: 7 g/dL (ref 6.0–8.3)

## 2017-05-25 LAB — H. PYLORI ANTIBODY, IGG: H Pylori IgG: NEGATIVE

## 2017-05-25 MED ORDER — SUCRALFATE 1 G PO TABS: 1.0000 g | ORAL_TABLET | Freq: Three times a day (TID) | ORAL | 0 refills | Status: DC

## 2017-05-25 MED ORDER — PANTOPRAZOLE SODIUM 40 MG PO TBEC: 40.0000 mg | DELAYED_RELEASE_TABLET | Freq: Every day | ORAL | 3 refills | Status: DC

## 2017-05-25 NOTE — Patient Instructions (Signed)
Please go to the lab today for blood work.  I will call you with your results. We will alter treatment regimen(s) if indicated by your results.   Please keep a bland diet (see instructions below).  Start the Pantoprazole once daily. Use the Carafate as directed over the next few days to coat the stomach and duodenum.   We are setting you up with Pulmonology for further assessment.   Follow-up with me in 1 week.

## 2017-05-25 NOTE — Progress Notes (Signed)
Patient presents to clinic today c/o 2 days of epigastric pain associated with heartburn and indigestion. Notes some nausea but denies vomiting. Denies melena, hematochezia or tenesmus. Notes looser stool without diarrhea. Denies recent travel or sick contact. Denies abnormal food or water source. Denies NSAID use or significant alcohol consumption. Notes multiple stressors recently that have been worrying him.  Of note, patient recently seen in the ER for atypical chest pain. Workup revealed negative troponin, EKG and CXR. CTA performed revealing mucoid impaction within slightly dilated left upper lobe bronchi. Was starting on Azithromycin for potential atypical infection. Has completed entire course of medication. Denies fever, chills, sweats or URI symptoms. 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    Past Medical History:  Diagnosis Date  . ADHD (attention deficit hyperactivity disorder)    Mild    Current Outpatient Medications on File Prior to Visit  Medication Sig Dispense Refill  . amphetamine-dextroamphetamine (ADDERALL) 10 MG tablet Take 1 tablet (10 mg total) by mouth daily with breakfast. 30 tablet 0  . zolpidem (AMBIEN) 10 MG tablet Take 1 tablet (10 mg total) by mouth at bedtime as needed for sleep. 15 tablet 2   No current facility-administered medications on file prior to visit.     No Known Allergies  Family History  Problem Relation Age of Onset  . Multiple sclerosis Mother   . Cancer Father 30       Prostate  . Hyperlipidemia Father     Social History   Socioeconomic History  . Marital status: Single    Spouse name: None  . Number of children: 1  . Years of education: None  . Highest education level: None  Social Needs  . Financial resource strain: None  . Food insecurity - worry: None  . Food insecurity - inability: None  . Transportation needs - medical: None  . Transportation needs -  non-medical: None  Occupational History  . Occupation: Engineer, production  Tobacco Use  . Smoking status: Never Smoker  . Smokeless tobacco: Never Used  Substance and Sexual Activity  . Alcohol use: Yes    Alcohol/week: 0.0 oz  . Drug use: No  . Sexual activity: Yes  Other Topics Concern  . None  Social History Narrative  . None   Review of Systems - See HPI.  All other ROS are negative.  BP 100/70 (BP Location: Left Arm, Patient Position: Sitting, Cuff Size: Normal)   Pulse 91   Temp 98.4 F (36.9 C) (Oral)   Resp 16   Ht 6' (1.829 m)   Wt 206 lb 4 oz (93.6 kg)   SpO2 98%   BMI 27.97 kg/m   Physical Exam  Constitutional: He is oriented to person, place, and time and well-developed, well-nourished, and in no distress.  HENT:  Head: Normocephalic and atraumatic.  Eyes: Conjunctivae are normal.  Cardiovascular: Normal rate, regular rhythm, normal heart sounds and intact distal pulses.  Pulmonary/Chest: Effort normal and breath sounds normal. No respiratory distress. He has no wheezes. He has no rales. He exhibits no tenderness.  Abdominal: Soft. Normal appearance and bowel sounds are normal. There is tenderness in the epigastric area and left upper quadrant.  Neurological: He is alert and oriented to person, place, and time.  Skin: Skin is warm and dry. No rash noted.  Psychiatric: Affect normal.  Vitals reviewed.   Recent Results (from the past 2160 hour(s))  Basic metabolic panel  Status: None   Collection Time: 05/13/17 10:05 PM  Result Value Ref Range   Sodium 140 135 - 145 mmol/L   Potassium 3.7 3.5 - 5.1 mmol/L   Chloride 101 101 - 111 mmol/L   CO2 24 22 - 32 mmol/L   Glucose, Bld 91 65 - 99 mg/dL   BUN 13 6 - 20 mg/dL   Creatinine, Ser 1.09 0.61 - 1.24 mg/dL   Calcium 9.4 8.9 - 10.3 mg/dL   GFR calc non Af Amer >60 >60 mL/min   GFR calc Af Amer >60 >60 mL/min    Comment: (NOTE) The eGFR has been calculated using the CKD EPI equation. This calculation has  not been validated in all clinical situations. eGFR's persistently <60 mL/min signify possible Chronic Kidney Disease.    Anion gap 15 5 - 15    Comment: Performed at [facility] [address], [place] [address]  CBC     Status: None   Collection Time: 05/13/17 10:05 PM  Result Value Ref Range   WBC 6.5 4.0 - 10.5 K/uL   RBC 5.03 4.22 - 5.81 MIL/uL   Hemoglobin 15.2 13.0 - 17.0 g/dL   HCT 45.0 39.0 - 52.0 %   MCV 89.5 78.0 - 100.0 fL   MCH 30.2 26.0 - 34.0 pg   MCHC 33.8 30.0 - 36.0 g/dL   RDW 13.4 11.5 - 15.5 %   Platelets 233 150 - 400 K/uL    Comment: Performed at [facility], [place] [address] [place], [place] [address]  I-stat troponin, ED     Status: None   Collection Time: 05/13/17 10:16 PM  Result Value Ref Range   Troponin i, poc 0.00 0.00 - 0.08 ng/mL   Comment 3            Comment: Due to the release kinetics of cTnI, a negative result within the first hours of the onset of symptoms does not rule out myocardial infarction with certainty. If myocardial infarction is still suspected, repeat the test at appropriate intervals.    Assessment/Plan: 1. Acute gastritis without hemorrhage, unspecified gastritis type Start Protonix and Carafate. GERD diet reviewed. Keep hydrated. Will check lab panel today to assess further. Close follow-up to be scheduled. Reviewed alarm signs/symptoms that would prompt ER assessment.  - pantoprazole (PROTONIX) 40 MG tablet; Take 1 tablet (40 mg total) by mouth daily.  Dispense: 30 tablet; Refill: 3 - sucralfate (CARAFATE) 1 g tablet; Take 1 tablet (1 g total) by mouth 4 (four) times daily -  with meals and at bedtime.  Dispense: 30 tablet; Refill: 0 - Comp Met (CMET) - CBC w/Diff - Lipase - H. pylori antibody, IgG  2. Abnormal CT of the chest Mucoid plug. Patient has completed Azithromycin given for potential atypical infection. Denies any URI symptoms but states he never had any. Noting some increased congestion  and tightness with running. Will send to Pulmonology for PFT and further assessment of CT findings.  - Ambulatory referral to Pulmonology    Leeanne Rio, PA-C

## 2017-06-26 ENCOUNTER — Ambulatory Visit (INDEPENDENT_AMBULATORY_CARE_PROVIDER_SITE_OTHER): Payer: BLUE CROSS/BLUE SHIELD | Admitting: Physician Assistant

## 2017-06-26 ENCOUNTER — Other Ambulatory Visit: Payer: Self-pay

## 2017-06-26 ENCOUNTER — Encounter: Payer: Self-pay | Admitting: Physician Assistant

## 2017-06-26 VITALS — BP 120/76 | HR 102 | Temp 102.6°F | Resp 16 | Ht 72.0 in | Wt 204.0 lb

## 2017-06-26 DIAGNOSIS — J101 Influenza due to other identified influenza virus with other respiratory manifestations: Secondary | ICD-10-CM | POA: Diagnosis not present

## 2017-06-26 DIAGNOSIS — J020 Streptococcal pharyngitis: Secondary | ICD-10-CM

## 2017-06-26 LAB — POCT RAPID STREP A (OFFICE): RAPID STREP A SCREEN: POSITIVE — AB

## 2017-06-26 LAB — POC INFLUENZA A&B (BINAX/QUICKVUE)
Influenza A, POC: POSITIVE — AB
Influenza B, POC: NEGATIVE

## 2017-06-26 MED ORDER — AMOXICILLIN 875 MG PO TABS: 875.0000 mg | ORAL_TABLET | Freq: Two times a day (BID) | ORAL | 0 refills | Status: DC

## 2017-06-26 MED ORDER — OSELTAMIVIR PHOSPHATE 75 MG PO CAPS: 75.0000 mg | ORAL_CAPSULE | Freq: Two times a day (BID) | ORAL | 0 refills | Status: DC

## 2017-06-26 MED ORDER — LIDOCAINE VISCOUS 2 % MT SOLN: 15.0000 mL | Freq: Four times a day (QID) | OROMUCOSAL | 0 refills | Status: DC | PRN

## 2017-06-26 NOTE — Progress Notes (Signed)
Patient presents to clinic today c/o 1 day of sore throat, cough, fever, aches and chills. Symptoms started suddenly. Denies chest pain, SOB. Denies sinus pressure or sinus pain but notes headaches. Also notes significant fatigue. Mother has been sick with the flu and his children have strep throat currently (just started treatment). Last dose of an antipyretic was last night.   Past Medical History:  Diagnosis Date  . ADHD (attention deficit hyperactivity disorder)    Mild    Current Outpatient Medications on File Prior to Visit  Medication Sig Dispense Refill  . amphetamine-dextroamphetamine (ADDERALL) 10 MG tablet Take 1 tablet (10 mg total) by mouth daily with breakfast. 30 tablet 0  . zolpidem (AMBIEN) 10 MG tablet Take 1 tablet (10 mg total) by mouth at bedtime as needed for sleep. 15 tablet 2  . pantoprazole (PROTONIX) 40 MG tablet Take 1 tablet (40 mg total) by mouth daily. (Patient not taking: Reported on 06/26/2017) 30 tablet 3  . sucralfate (CARAFATE) 1 g tablet Take 1 tablet (1 g total) by mouth 4 (four) times daily -  with meals and at bedtime. (Patient not taking: Reported on 06/26/2017) 30 tablet 0   No current facility-administered medications on file prior to visit.     No Known Allergies  Family History  Problem Relation Age of Onset  . Multiple sclerosis Mother   . Cancer Father 50       Prostate  . Hyperlipidemia Father     Social History   Socioeconomic History  . Marital status: Single    Spouse name: Not on file  . Number of children: 1  . Years of education: Not on file  . Highest education level: Not on file  Occupational History  . Occupation: Engineer, water  . Financial resource strain: Not on file  . Food insecurity:    Worry: Not on file    Inability: Not on file  . Transportation needs:    Medical: Not on file    Non-medical: Not on file  Tobacco Use  . Smoking status: Never Smoker  . Smokeless tobacco: Never Used  Substance  and Sexual Activity  . Alcohol use: Yes    Alcohol/week: 0.0 oz  . Drug use: No  . Sexual activity: Yes  Lifestyle  . Physical activity:    Days per week: Not on file    Minutes per session: Not on file  . Stress: Not on file  Relationships  . Social connections:    Talks on phone: Not on file    Gets together: Not on file    Attends religious service: Not on file    Active member of club or organization: Not on file    Attends meetings of clubs or organizations: Not on file    Relationship status: Not on file  Other Topics Concern  . Not on file  Social History Narrative  . Not on file   Review of Systems - See HPI.  All other ROS are negative.  BP 120/76   Pulse (!) 102   Temp (!) 102.6 F (39.2 C) (Oral)   Resp 16   Ht 6' (1.829 m)   Wt 204 lb (92.5 kg)   SpO2 98%   BMI 27.67 kg/m   Physical Exam  Constitutional: He appears well-developed and well-nourished. No distress.  HENT:  Head: Normocephalic and atraumatic.  Right Ear: Tympanic membrane normal.  Left Ear: Tympanic membrane normal.  Nose: No mucosal edema. Right sinus  exhibits no maxillary sinus tenderness and no frontal sinus tenderness. Left sinus exhibits no maxillary sinus tenderness and no frontal sinus tenderness.  Eyes: Conjunctivae are normal.  Neck: Neck supple.  Cardiovascular: Regular rhythm, normal heart sounds and intact distal pulses.  Pulmonary/Chest: Effort normal and breath sounds normal. No stridor. No respiratory distress. He has no wheezes. He has no rales. He exhibits no tenderness.  Lymphadenopathy:    He has cervical adenopathy.  Skin: Skin is warm. No rash noted.  Vitals reviewed.  Recent Results (from the past 2160 hour(s))  Basic metabolic panel     Status: None   Collection Time: 05/13/17 10:05 PM  Result Value Ref Range   Sodium 140 135 - 145 mmol/L   Potassium 3.7 3.5 - 5.1 mmol/L   Chloride 101 101 - 111 mmol/L   CO2 24 22 - 32 mmol/L   Glucose, Bld 91 65 - 99 mg/dL    BUN 13 6 - 20 mg/dL   Creatinine, Ser 1.09 0.61 - 1.24 mg/dL   Calcium 9.4 8.9 - 10.3 mg/dL   GFR calc non Af Amer >60 >60 mL/min   GFR calc Af Amer >60 >60 mL/min    Comment: (NOTE) The eGFR has been calculated using the CKD EPI equation. This calculation has not been validated in all clinical situations. eGFR's persistently <60 mL/min signify possible Chronic Kidney Disease.    Anion gap 15 5 - 15    Comment: Performed at [facility] [address] [place] [address]  CBC     Status: None   Collection Time: 05/13/17 10:05 PM  Result Value Ref Range   WBC 6.5 4.0 - 10.5 K/uL   RBC 5.03 4.22 - 5.81 MIL/uL   Hemoglobin 15.2 13.0 - 17.0 g/dL   HCT 45.0 39.0 - 52.0 %   MCV 89.5 78.0 - 100.0 fL   MCH 30.2 26.0 - 34.0 pg   MCHC 33.8 30.0 - 36.0 g/dL   RDW 13.4 11.5 - 15.5 %   Platelets 233 150 - 400 K/uL    Comment: Performed at [facility], [place] [address] [place], [place] [address]  I-stat troponin, ED     Status: None   Collection Time: 05/13/17 10:16 PM  Result Value Ref Range   Troponin i, poc 0.00 0.00 - 0.08 ng/mL   Comment 3            Comment: Due to the release kinetics of cTnI, a negative result within the first hours of the onset of symptoms does not rule out myocardial infarction with certainty. If myocardial infarction is still suspected, repeat the test at appropriate intervals.   Comp Met (CMET)     Status: Abnormal   Collection Time: 05/25/17  2:51 PM  Result Value Ref Range   Sodium 138 135 - 145 mEq/L   Potassium 3.8 3.5 - 5.1 mEq/L   Chloride 100 96 - 112 mEq/L   CO2 32 19 - 32 mEq/L   Glucose, Bld 114 (H) 70 - 99 mg/dL   BUN 16 6 - 23 mg/dL   Creatinine, Ser 1.20 0.40 - 1.50 mg/dL   Total Bilirubin 0.5 0.2 - 1.2 mg/dL   Alkaline Phosphatase 83 39 - 117 U/L   AST 15 0 - 37 U/L   ALT 23 0 - 53 U/L   Total Protein 7.0 6.0 - 8.3 g/dL   Albumin 4.7 3.5 - 5.2 g/dL   Calcium 9.4 8.4 - 10.5  mg/dL   GFR 73.79 >60.00 mL/min  CBC  w/Diff     Status: Abnormal   Collection Time: 05/25/17  2:51 PM  Result Value Ref Range   WBC 5.9 4.0 - 10.5 K/uL   RBC 5.45 4.22 - 5.81 Mil/uL   Hemoglobin 16.4 13.0 - 17.0 g/dL   HCT 47.7 39.0 - 52.0 %   MCV 87.5 78.0 - 100.0 fl   MCHC 34.4 30.0 - 36.0 g/dL   RDW 13.2 11.5 - 15.5 %   Platelets 218.0 150.0 - 400.0 K/uL   Neutrophils Relative % 84.6 (H) 43.0 - 77.0 %   Lymphocytes Relative 7.8 Repeated and verified X2. (L) 12.0 - 46.0 %   Monocytes Relative 5.9 3.0 - 12.0 %   Eosinophils Relative 1.4 0.0 - 5.0 %   Basophils Relative 0.3 0.0 - 3.0 %   Neutro Abs 5.0 1.4 - 7.7 K/uL   Lymphs Abs 0.5 (L) 0.7 - 4.0 K/uL   Monocytes Absolute 0.3 0.1 - 1.0 K/uL   Eosinophils Absolute 0.1 0.0 - 0.7 K/uL   Basophils Absolute 0.0 0.0 - 0.1 K/uL  Lipase     Status: None   Collection Time: 05/25/17  2:51 PM  Result Value Ref Range   Lipase 20.0 11.0 - 59.0 U/L  H. pylori antibody, IgG     Status: None   Collection Time: 05/25/17  2:51 PM  Result Value Ref Range   H Pylori IgG Negative Negative    Assessment/Plan: 1. Strep pharyngitis + contact. Strep test +. Start Amoxicillin 875 mg BID x 10 days. Supportive measures and OTC medications reviewed. Rx viscous lidocaine. - POC Influenza A&B(BINAX/QUICKVUE) - POCT rapid strep A - lidocaine (XYLOCAINE) 2 % solution; Use as directed 15 mLs in the mouth or throat every 6 (six) hours as needed for mouth pain.  Dispense: 100 mL; Refill: 0  2. Influenza A Flu swab + A. Start Tamiflu. Supportive measures and OTC medications reviewed. Strict return and ER precautions reviewed with patient. AVS given.  - oseltamivir (TAMIFLU) 75 MG capsule; Take 1 capsule (75 mg total) by mouth 2 (two) times daily.  Dispense: 10 capsule; Refill: 0   Leeanne Rio, PA-C

## 2017-06-26 NOTE — Patient Instructions (Addendum)
Please take the amoxicillin as directed with food for the strep throat. The Tamiflu is for the influenza.  Keep very well-hydrated and get plenty of rest.  Do saline nasal rinses to help with any nasal congestion or post-nasal drip that develop. Alternate Tylenol and Motrin every for fever.  Use the viscous lidocaine as directed to help numb the throat.   Avoid contact with the young or elderly. Put a humidifier in the bedroom.  No work until fever free for 24 hours without medication.   If anything worsens or you get fever >102 not correctable by tylenol/motrin, please go to the ER.

## 2017-06-27 ENCOUNTER — Encounter: Payer: Self-pay | Admitting: Physician Assistant

## 2017-06-27 MED ORDER — TRAMADOL HCL 50 MG PO TABS: 50.0000 mg | ORAL_TABLET | Freq: Two times a day (BID) | ORAL | 0 refills | Status: DC | PRN

## 2017-08-13 ENCOUNTER — Other Ambulatory Visit (INDEPENDENT_AMBULATORY_CARE_PROVIDER_SITE_OTHER): Payer: BLUE CROSS/BLUE SHIELD

## 2017-08-13 ENCOUNTER — Ambulatory Visit (INDEPENDENT_AMBULATORY_CARE_PROVIDER_SITE_OTHER): Payer: BLUE CROSS/BLUE SHIELD | Admitting: Internal Medicine

## 2017-08-13 ENCOUNTER — Encounter: Payer: Self-pay | Admitting: Internal Medicine

## 2017-08-13 VITALS — BP 120/74 | HR 65 | Ht 72.0 in | Wt 205.8 lb

## 2017-08-13 DIAGNOSIS — R0602 Shortness of breath: Secondary | ICD-10-CM | POA: Diagnosis not present

## 2017-08-13 DIAGNOSIS — R0609 Other forms of dyspnea: Secondary | ICD-10-CM | POA: Diagnosis not present

## 2017-08-13 DIAGNOSIS — J45909 Unspecified asthma, uncomplicated: Secondary | ICD-10-CM

## 2017-08-13 DIAGNOSIS — R918 Other nonspecific abnormal finding of lung field: Secondary | ICD-10-CM

## 2017-08-13 DIAGNOSIS — R0600 Dyspnea, unspecified: Secondary | ICD-10-CM

## 2017-08-13 DIAGNOSIS — R058 Other specified cough: Secondary | ICD-10-CM

## 2017-08-13 DIAGNOSIS — R05 Cough: Secondary | ICD-10-CM

## 2017-08-13 LAB — NITRIC OXIDE: NITRIC OXIDE: 69

## 2017-08-13 LAB — CBC WITH DIFFERENTIAL/PLATELET
BASOS ABS: 0 10*3/uL (ref 0.0–0.1)
Basophils Relative: 0.6 % (ref 0.0–3.0)
EOS PCT: 2.9 % (ref 0.0–5.0)
Eosinophils Absolute: 0.2 10*3/uL (ref 0.0–0.7)
HEMATOCRIT: 43.9 % (ref 39.0–52.0)
HEMOGLOBIN: 14.9 g/dL (ref 13.0–17.0)
LYMPHS ABS: 1.4 10*3/uL (ref 0.7–4.0)
LYMPHS PCT: 22.3 % (ref 12.0–46.0)
MCHC: 33.9 g/dL (ref 30.0–36.0)
MCV: 87.6 fl (ref 78.0–100.0)
MONOS PCT: 9 % (ref 3.0–12.0)
Monocytes Absolute: 0.6 10*3/uL (ref 0.1–1.0)
Neutro Abs: 4.1 10*3/uL (ref 1.4–7.7)
Neutrophils Relative %: 65.2 % (ref 43.0–77.0)
Platelets: 254 10*3/uL (ref 150.0–400.0)
RBC: 5.01 Mil/uL (ref 4.22–5.81)
RDW: 13.7 % (ref 11.5–15.5)
WBC: 6.2 10*3/uL (ref 4.0–10.5)

## 2017-08-13 MED ORDER — PREDNISONE 10 MG PO TABS: ORAL_TABLET | ORAL | 0 refills | Status: DC

## 2017-08-13 MED ORDER — FLUTICASONE FUROATE-VILANTEROL 100-25 MCG/INH IN AEPB: 1.0000 | INHALATION_SPRAY | Freq: Every day | RESPIRATORY_TRACT | 5 refills | Status: DC

## 2017-08-13 MED ORDER — ALBUTEROL SULFATE HFA 108 (90 BASE) MCG/ACT IN AERS: 2.0000 | INHALATION_SPRAY | Freq: Four times a day (QID) | RESPIRATORY_TRACT | 6 refills | Status: DC | PRN

## 2017-08-13 NOTE — Progress Notes (Signed)
Subjective:    Patient ID: Victor DurandMatthew Martinez, male    DOB: Oct 15, 1985, 32 y.o.   MRN: [id number]  PCP Waldon MerlMartin, William C, PA-C  HPI  IOV 08/13/2017   Chief Complaint  Patient presents with  . Consult    Referred by Marcelline MatesWilliam Martin, PA due to abnormal CT. Pt had ct antio 3/4. Pt states when he exercises, he will cough up clear mucus and will also have some heaviness in chest. Pt states he will also have random CP.Marland Kitchen.    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  He tells me that for the last year or so he has had exertional dyspnea particularly after running 8 miles.  Of note he has a strong exercise discipline and does high intensity interval training, outdoor running weight training etc.  He will also notices exertional dyspnea after doing any kind of high intensity interval training but particularly any aerobic activity.  At the end of the run he would feel more dyspneic than usual and also in addition he will feel like his year particularly the left ear is echoing.  The following day after such exertion he would have significant cough with unspecified sputum and chest tightness.  Against this background he is also felt intermittently nonspecific desire to take a deep breath while at home at rest.  Then on May 13, 2017 he had an acute episode of chest tightness particularly in the left side and presented to the ER where according to his history of pulmonary embolism was ruled out.  But the CT chest showed some left upper lobe infiltrates with mucous plugging raising the question of ABPA.  I personally visualized the CT chest and do agree with the findings.  Rest of lung parenchyma looked normal.  His other symptoms have persisted since then.  At that point ER did give him an antibiotic and this did help him.  Review of the chart also shows that in April he did visit primary care physician for strep  pharyngitis.  Home exposure history: He does not think there is mold or cockroaches in the house.  Family history: He thinks his elder brother might have asthma  Allergy history: He denies any spring allergies.  Personal exposure history: He denies any drugs of abuse or cigarette smoking.   feno - 08/13/2017 - 69ppb elevated.  Results for Victor DurandRIDDLE, Cosby (MRN [id number]) as of 08/13/2017 09:46  Ref. Range 06/08/2011 18:41 12/07/2016 08:48 05/25/2017 14:51  Eosinophils Absolute Latest Ref Range: 0.0 - 0.7 K/uL  0.2 0.1     has a past medical history of ADHD (attention deficit hyperactivity disorder).   reports that he has never smoked. He has never used smokeless tobacco.  Past Surgical History:  Procedure Laterality Date  . NO PAST SURGERIES      No Known Allergies  Immunization History  Administered Date(s) Administered  . Influenza,inj,Quad PF,6+ Mos 12/04/2016  . Td 10/28/2007  . Tdap 12/07/2016    Family History  Problem Relation Age of Onset  . Multiple sclerosis Mother   . Cancer Father [phone]       Prostate  . Hyperlipidemia Father      Current Outpatient Medications:  .  amphetamine-dextroamphetamine (ADDERALL) 10 MG tablet, Take 1 tablet (10 mg total) by mouth daily with breakfast., Disp: 30 tablet, Rfl: 0 .  zolpidem (AMBIEN) 10 MG tablet, Take 1 tablet (10 mg total) by mouth at  bedtime as needed for sleep., Disp: 15 tablet, Rfl: 2    Review of Systems  Constitutional: Negative for fever and unexpected weight change.  HENT: Negative for congestion, dental problem, ear pain, nosebleeds, postnasal drip, rhinorrhea, sinus pressure, sneezing, sore throat and trouble swallowing.   Eyes: Negative for redness and itching.  Respiratory: Positive for cough and chest tightness. Negative for shortness of breath and wheezing.   Cardiovascular: Positive for chest pain. Negative for palpitations and leg swelling.  Gastrointestinal: Negative for nausea and vomiting.   Genitourinary: Negative for dysuria.  Musculoskeletal: Negative for joint swelling.  Skin: Negative for rash.  Allergic/Immunologic: Negative.  Negative for environmental allergies, food allergies and immunocompromised state.  Neurological: Negative for headaches.  Hematological: Does not bruise/bleed easily.  Psychiatric/Behavioral: Negative for dysphoric mood. The patient is not nervous/anxious.        Objective:   Physical Exam  Constitutional: He is oriented to person, place, and time. He appears well-developed and well-nourished. No distress.  HENT:  Head: Normocephalic and atraumatic.  Right Ear: External ear normal.  Left Ear: External ear normal.  Mouth/Throat: Oropharynx is clear and moist. No oropharyngeal exudate.  Eyes: Pupils are equal, round, and reactive to light. Conjunctivae and EOM are normal. Right eye exhibits no discharge. Left eye exhibits no discharge. No scleral icterus.  Neck: Normal range of motion. Neck supple. No JVD present. No tracheal deviation present. No thyromegaly present.  Cardiovascular: Normal rate, regular rhythm and intact distal pulses. Exam reveals no gallop and no friction rub.  No murmur heard. Pulmonary/Chest: Effort normal and breath sounds normal. No respiratory distress. He has no wheezes. He has no rales. He exhibits no tenderness.  Abdominal: Soft. Bowel sounds are normal. He exhibits no distension and no mass. There is no tenderness. There is no rebound and no guarding.  Musculoskeletal: Normal range of motion. He exhibits no edema or tenderness.  Lymphadenopathy:    He has no cervical adenopathy.  Neurological: He is alert and oriented to person, place, and time. He has normal reflexes. No cranial nerve deficit. Coordination normal.  Skin: Skin is warm and dry. No rash noted. He is not diaphoretic. No erythema. No pallor.  Psychiatric: He has a normal mood and affect. His behavior is normal. Judgment and thought content normal.   Nursing note and vitals reviewed.  Vitals:   08/13/17 0938  BP: 120/74  Pulse: 65  SpO2: 99%  Weight: 205 lb 12.8 oz (93.4 kg)  Height: 6' (1.829 m)    Estimated body mass index is 27.91 kg/m as calculated from the following:   Height as of this encounter: 6' (1.829 m).   Weight as of this encounter: 205 lb 12.8 oz (93.4 kg).          Assessment & Plan:     ICD-10-CM   1. Asthma, unspecified asthma severity, unspecified whether complicated, unspecified whether persistent J45.909 Nitric oxide    CBC w/Diff    IgE    Resp Allergy Profile Regn2DC DE MD [facility]    Pulmonary function test  2. Dyspnea on exertion R06.09   3. Exercise-induced coughing episode R05   4. Pulmonary infiltrate R91.8   5. 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; the nitric oxide test was consistent with this diagnosis  We do need followup 3 month of the abnormal infiltrate in left upper lobe - March 2019  Plan -  check blood cbc with diff, IgE, blood allergy panel - will call with results - Please take prednisone 40 mg x1 day, then 30 mg x1 day, then 20 mg x1 day, then 10 mg x1 day, and then 5 mg x1 day and stop - start low dose breo  1 puff daily - start albuterol as needed; but particularly 5-10 minutes pre-exercise start it and can take post exercise as well  - do warm up and cool down with exercise - do CT chest without contrast next few to several weeks =- do spirometry pre and post BD and DLCO - next few to several weeks   Folllwup 4 weeks after CT and spiro and to give [place] feedback on response; can see APP    Dr. Kalman Shan, M.D., [facility].C.P Pulmonary and Critical Care Medicine Staff Physician, [facility] Director - Interstitial Lung Disease  Program  Pulmonary Fibrosis [facility] Network at [facility] [place], [place], [address]  Pager: [phone], If no answer or  between  15:00h - 7:00h: call [phone] Telephone: [phone]

## 2017-08-13 NOTE — Addendum Note (Signed)
Addended by: Wyvonne LenzPINION, Mate Alegria P on: 08/13/2017 10:18 AM   Modules accepted: Orders

## 2017-08-13 NOTE — Patient Instructions (Addendum)
ICD-10-CM   1. Asthma, unspecified asthma severity, unspecified whether complicated, unspecified whether persistent J45.909   2. Dyspnea on exertion R06.09   3. Exercise-induced coughing episode R05   4. 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; the nitric oxide test was consistent with this diagnosis  We do need followup 3 month of the abnormal infiltrate in left upper lobe - March 2019  Plan - check blood cbc with diff, IgE, blood allergy panel - will call with results - Please take prednisone 40 mg x1 day, then 30 mg x1 day, then 20 mg x1 day, then 10 mg x1 day, and then 5 mg x1 day and stop - start low dose breo  1 puff daily - start albuterol as needed; but particularly 5-10 minutes pre-exercise start it and can take post exercise as well  - do warm up and cool down with exercise - do CT chest without contrast next few to several weeks =- do spirometry pre and post BD and DLCO - next few to several weeks   Folllwup 4 weeks after CT and spiro and to give us feedback on response; can see APP

## 2017-08-14 LAB — RESPIRATORY ALLERGY PROFILE REGION II ~~LOC~~
Allergen, A. alternata, m6: <0.1 kU/L
Allergen, Cedar tree, t12: <0.1 kU/L
Allergen, Comm Silver Birch, t9: <0.1 kU/L
Allergen, Cottonwood, t14: 0.14 kU/L — ABNORMAL HIGH
Allergen, D pternoyssinus,d7: 7.05 kU/L — ABNORMAL HIGH
Allergen, Mulberry, t76: <0.1 kU/L
Bermuda Grass: 0.22 kU/L — ABNORMAL HIGH
CLADOSPORIUM HERBARUM (M2) IGE: <0.1 kU/L
CLASS: 0
CLASS: 0
CLASS: 0
CLASS: 0
CLASS: 0
CLASS: 2
CLASS: 3
CLASS: 3
CLASS: 3
COCKROACH: 0.19 kU/L — AB
COMMON RAGWEED (SHORT) (W1) IGE: 0.12 kU/L — ABNORMAL HIGH
Cat Dander: 0.18 kU/L — ABNORMAL HIGH
Class: 0
Class: 0
Class: 0
Class: 0
Class: 0
Class: 0
Class: 0
Class: 0
Class: 0
Class: 0
Class: 0
Class: 0
Class: 0
Class: 0
Class: 0
D. FARINAE: 6.86 kU/L — AB
Dog Dander: 6.51 kU/L — ABNORMAL HIGH
ELM IGE: 0.16 kU/L — AB
IGE (IMMUNOGLOBULIN E), SERUM: 73 kU/L (ref <=114)
JOHNSON GRASS: 0.28 kU/L — AB
Pecan/Hickory Tree IgE: 0.17 kU/L — ABNORMAL HIGH
Rough Pigweed  IgE: <0.1 kU/L
Sheep Sorrel IgE: <0.1 kU/L
TIMOTHY GRASS: 2.7 kU/L — AB

## 2017-08-14 LAB — INTERPRETATION:

## 2017-08-14 NOTE — Telephone Encounter (Signed)
1. Please have him Glenna DurandMatthew Berkey come and get = breo samples for 6 weeks (3 samples) 2. What is preferred on his insurance ? Do you know that or he can call his insurance? 3. Part of pricing migh be if he is on a HDHP - is he on one? 4. Do we have discount cards for breo or symbicort or dulera? - if so we can give him that  And - he DOES NOT HAVE COPD  Thanks  Dr. Kalman ShanMurali Ericia Moxley, M.D., [facility].P Pulmonary and Critical Care Medicine Staff Physician, [facility] Center Director - Interstitial Lung Disease  Program  Pulmonary Fibrosis [facility] - [facility] at [facility] [place], [place], [id number]  Pager: [phone], If no answer or between  15:00h - 7:00h: call [phone] Telephone: [phone]

## 2017-08-14 NOTE — Telephone Encounter (Signed)
Dr. Ramaswamy - please advise. Thanks. 

## 2017-08-15 ENCOUNTER — Encounter: Payer: Self-pay | Admitting: Internal Medicine

## 2017-08-16 ENCOUNTER — Telehealth: Payer: Self-pay | Admitting: Internal Medicine

## 2017-08-16 MED ORDER — BUDESONIDE-FORMOTEROL FUMARATE 80-4.5 MCG/ACT IN AERO: 2.0000 | INHALATION_SPRAY | Freq: Two times a day (BID) | RESPIRATORY_TRACT | 12 refills | Status: DC

## 2017-08-16 MED ORDER — BUDESONIDE-FORMOTEROL FUMARATE 80-4.5 MCG/ACT IN AERO: 2.0000 | INHALATION_SPRAY | Freq: Two times a day (BID) | RESPIRATORY_TRACT | 0 refills | Status: DC

## 2017-08-16 NOTE — Telephone Encounter (Signed)
MR please advise, patient is wanting to know if there is another option he can take instead of the [facility] since it is so expensive. He also wants to make sure that he has asthma and not COPD.    Please advise, thank you.

## 2017-08-16 NOTE — Telephone Encounter (Signed)
See other phone note - Virgel BouquetBreo is also indicatede in asthma. But gigven fact my office indicated there are cheaper options - go with symbicort/dulera. We can over time taper the inhalers . Understand the need to minimize treatment but there is fair amount of infilammation in lungs. Once we get control of this and during followup visits, we can start tapering down

## 2017-08-16 NOTE — Telephone Encounter (Signed)
Pt is aware of below message and voiced his understanding, Rx for Symbicort 80 has sent to preferred pharmacy. Sample of symbicort 80 and discount card has been placed up front for pick up. Nothing further is needed.

## 2017-08-16 NOTE — Telephone Encounter (Signed)
Pt advised that Virgel BouquetBreo is over $400 per month with his insurance. Pt would like to know if there is something different that is more affordable and effective.  Routing message to MR to review.  MR please advise

## 2017-08-16 NOTE — Telephone Encounter (Signed)
Please give dulera 100/4.5 or symbicort 80/4.5 -  Script and also if we have - samples  with discount card - to take 2 puff twice daily  Use albouterol a sneeded  P

## 2017-08-28 ENCOUNTER — Ambulatory Visit (INDEPENDENT_AMBULATORY_CARE_PROVIDER_SITE_OTHER)
Admission: RE | Admit: 2017-08-28 | Discharge: 2017-08-28 | Disposition: A | Discharge status: Home / Self Care | Payer: BLUE CROSS/BLUE SHIELD | Source: Ambulatory Visit | Attending: Internal Medicine | Admitting: Internal Medicine

## 2017-08-28 DIAGNOSIS — R918 Other nonspecific abnormal finding of lung field: Secondary | ICD-10-CM

## 2017-08-28 DIAGNOSIS — J9809 Other diseases of bronchus, not elsewhere classified: Secondary | ICD-10-CM | POA: Diagnosis not present

## 2017-09-02 ENCOUNTER — Telehealth: Payer: Self-pay | Admitting: Internal Medicine

## 2017-09-02 NOTE — Telephone Encounter (Signed)
Let Glenna DurandMatthew Sooy know that CT scan is much improved. HE will need a follow up scan to follow for complete resultion in 6 months or so. We can order that during his office followup  Thanks  Dr. Kalman ShanMurali Jasamine Pottinger, M.D., [facility]C.P Pulmonary and Critical Care Medicine Staff Physician, [facility] Center Director - Interstitial Lung Disease  Program  Pulmonary Fibrosis [facility] - [facility] at [facility] [place], [place], [address]  Pager: [phone], If no answer or between  15:00h - 7:00h: call [phone] Telephone: [phone]      IMPRESSION: 1. Near complete resolution of previously noted mucoid impaction within dilated left upper lobe bronchi. Residual 7 mm endobronchial filling defect is identified on the current exam, nonspecific. Consider follow-up imaging in 3-6 months to ensure complete resolution and to rule out underlying lesion.   Electronically Signed By: Signa Kellaylor Stroud M.D. On: 08/28/2017 10:30

## 2017-09-03 ENCOUNTER — Encounter: Payer: Self-pay | Admitting: Internal Medicine

## 2017-09-04 NOTE — Telephone Encounter (Signed)
Called and spoke with pt letting him know the results of the ct scan.  Pt expressed understanding. Nothing further needed.

## 2017-10-03 ENCOUNTER — Ambulatory Visit: Payer: BLUE CROSS/BLUE SHIELD | Admitting: Internal Medicine

## 2017-10-17 ENCOUNTER — Encounter: Payer: Self-pay | Admitting: Internal Medicine

## 2017-10-17 ENCOUNTER — Ambulatory Visit (INDEPENDENT_AMBULATORY_CARE_PROVIDER_SITE_OTHER): Payer: BLUE CROSS/BLUE SHIELD | Admitting: Internal Medicine

## 2017-10-17 VITALS — BP 114/68 | HR 67 | Ht 72.0 in | Wt 206.0 lb

## 2017-10-17 DIAGNOSIS — R058 Other specified cough: Secondary | ICD-10-CM

## 2017-10-17 DIAGNOSIS — R05 Cough: Secondary | ICD-10-CM | POA: Diagnosis not present

## 2017-10-17 DIAGNOSIS — R918 Other nonspecific abnormal finding of lung field: Secondary | ICD-10-CM | POA: Diagnosis not present

## 2017-10-17 NOTE — Progress Notes (Signed)
Subjective:     Patient ID: Victor Martinez, male   DOB: Feb 24, 1986, 32 y.o.   MRN: [id number]  HPI  PCP Waldon Merl, PA-C  HPI  IOV 08/13/2017   Chief Complaint  Patient presents with  . Consult    Referred by Marcelline Mates, PA due to abnormal CT. Pt had ct antio 3/4. Pt states when he exercises, he will cough up clear mucus and will also have some heaviness in chest. 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  He tells me that for the last year or so he has had exertional dyspnea particularly after running 8 miles.  Of note he has a strong exercise discipline and does high intensity interval training, outdoor running weight training etc.  He will also notices exertional dyspnea after doing any kind of high intensity interval training but particularly any aerobic activity.  At the end of the run he would feel more dyspneic than usual and also in addition he will feel like his year particularly the left ear is echoing.  The following day after such exertion he would have significant cough with unspecified sputum and chest tightness.  Against this background he is also felt intermittently nonspecific desire to take a deep breath while at home at rest.  Then on May 13, 2017 he had an acute episode of chest tightness particularly in the left side and presented to the ER where according to his history of pulmonary embolism was ruled out.  But the CT chest showed some left upper lobe infiltrates with mucous plugging raising the question of ABPA.  I personally visualized the CT chest and do agree with the findings.  Rest of lung parenchyma looked normal.  His other symptoms have persisted since then.  At that point ER did give him an antibiotic and this did help him.  Review of the chart also shows that in April he did visit primary care physician for strep  pharyngitis.  Home exposure history: He does not think there is mold or cockroaches in the house.  Family history: He thinks his elder brother might have asthma  Allergy history: He denies any spring allergies.  Personal exposure history: He denies any drugs of abuse or cigarette smoking.   feno - 08/13/2017 - 69ppb elevated.  Results for CREEDON, DANIELSKI (MRN [id number]) as of 08/13/2017 09:46  Ref. Range 06/08/2011 18:41 12/07/2016 08:48 05/25/2017 14:51  Eosinophils Absolute Latest Ref Range: 0.0 - 0.7 K/uL  0.2 0.1    OV 10/17/2017 (MRN [id number]) as of 08/13/2017 09:46  Ref. Range 06/08/2011 18:41 12/07/2016 08:48 05/25/2017 14:51  Eosinophils Absolute Latest Ref Range: 0.0 - 0.7 K/uL  0.2 0.1    OV 10/17/2017 Chief Complaint  Patient presents with  . Follow-up    Pt states he has been doing good since last visit. Denies any complaints of cough, SOB, or CP. Pt is not using his rescue inhaler as well as his symbicort as he feels like he does not need them. He does not like using meds and does not feel like he has asthma as the reason wy he does not want to use the inhalers and wants to discuss this with MR.     Victor Martinez presents for followup  1. HE has EIB symptoms -> for this last visit we did workup - xhaled nitric oxide was high. Blood allergy workup was positive. But IgE was normal. We placed him on Symbicort. Today he tells me that he has not been taking his Symbicort because he feels well. He says that  symptoms that come after her long-run have improved. He only needs occasional albuterol as needed. He does not feel the need to take daily Symbicort. Today nitric oxide shows improvement but is still in the 40s which is in the indeterminate zone  2. Abnormal CT scan because of his respiratory symptoms and in the ER in March 2019 and had a left upper lobe mucus plugging. Therefore we did follow-up CT scan of the chest in June 2019 that showed improvement but with residual filling defect.He feels asymptomatic but he is worried about the filling defect. Particularly becauseunder his new anticipated health insurance plan he has a high deductible. Radiologist has  reported this is nonspecific finding and is recommended a follow-up CT within 6 months.    Results for MOHID, FURUYA (MRN [id number]) as of 10/17/2017 12:21  Ref. Range 08/13/2017 10:16 10/17/2017   Nitric Oxide Unknown 69 45     has a past medical history of ADHD (attention deficit hyperactivity disorder).   reports that he has never smoked. He has never used smokeless tobacco.  Past Surgical History:  Procedure Laterality Date  . NO PAST SURGERIES      No Known Allergies  Immunization History  Administered Date(s) Administered  . Influenza,inj,Quad PF,6+ Mos 12/04/2016  . Td 10/28/2007  . Tdap 12/07/2016    Family History  Problem Relation Age of Onset  . Multiple sclerosis Mother   . Cancer Father 61       Prostate  . Hyperlipidemia Father      Current Outpatient Medications:  .  amphetamine-dextroamphetamine (ADDERALL) 10 MG tablet, Take 1 tablet (10 mg total) by mouth daily with breakfast. (Patient not taking: Reported on 10/17/2017), Disp: 30 tablet, Rfl: 0 .  zolpidem (AMBIEN) 10 MG tablet, Take 1 tablet (10 mg total) by mouth at bedtime as needed for sleep. (Patient not taking: Reported on 10/17/2017), Disp: 15 tablet, Rfl: 2   Review of Systems     Objective:   Physical Exam  Constitutional: He is oriented to person, place, and time. He appears well-developed and well-nourished. No distress.  HENT:  Head: Normocephalic and atraumatic.  Right Ear: External ear normal.  Left Ear: External ear normal.  Mouth/Throat: Oropharynx is clear and moist. No oropharyngeal exudate.  Eyes: Pupils are equal, round, and reactive to light. Conjunctivae and EOM are normal. Right eye exhibits no discharge. Left eye exhibits no discharge. No scleral icterus.  Neck: Normal range of motion. Neck supple. No JVD present. No tracheal deviation present. No thyromegaly present.  Cardiovascular: Normal rate, regular rhythm and intact distal pulses. Exam reveals no gallop and no friction  rub.  No murmur heard. Pulmonary/Chest: Effort normal and breath sounds normal. No respiratory distress. He has no wheezes. He has no rales. He exhibits no tenderness.  Abdominal: Soft. Bowel sounds are normal. He exhibits no distension and no mass. There is no tenderness. There is no rebound and no guarding.  Musculoskeletal: Normal range of motion. He exhibits no edema or tenderness.  Lymphadenopathy:    He has no cervical adenopathy.  Neurological: He is alert and oriented to person, place, and time. He has normal reflexes. No cranial nerve deficit. Coordination normal.  Skin: Skin is warm and dry. No rash noted. He is not diaphoretic. No erythema. No pallor.  Psychiatric: He has a normal mood and affect. His behavior is normal. Judgment and thought content normal.  Nursing note and vitals reviewed.  Vitals:   10/17/17 1211  BP: 114/68  Pulse:  67  SpO2: 99%  Weight: 206 lb (93.4 kg)  Height: 6' (1.829 m)       Assessment:       ICD-10-CM   1. Exercise-induced coughing episode R05   2. Pulmonary infiltrate R91.8        Plan:     Exercise-induced coughing episode  - this Is reflective of asthma given positive allergy testing and mildly elevated nitric oxide testing in past  -given currently feeling well - agree no need for symbicort - you can do good warm up and cool down and do albuterol pre-run - if cold or pollen bother you and go to asthma symptoms - let us know  Pulmonary infiltrate - filling defect June 2019 left upper lobe CT likely reflective of healing proceees - we can readdress ths in 1 year with CT chest or CXR  Followup 1 year and at followup we can ddecide imaging    Dr. Kalman ShanMurali Lynasia Meloche, M.D., [facility]C.P Pulmonary and Critical Care Medicine Staff Physician, [facility] Center Director - Interstitial Lung Disease  Program  Pulmonary Fibrosis [facility] - [facility] Network at [facility] [place], [place], [id number]  Pager: [phone], If no  answer or between  15:00h - 7:00h: call [phone] Telephone: [phone]

## 2017-10-17 NOTE — Patient Instructions (Addendum)
Exercise-induced coughing episode  - this Is reflective of asthma given positive allergy testing and mildly elevated nitric oxide testing in past  -given currently feeling well - agree no need for symbicort - you can do good warm up and cool down and do albuterol pre-run - if cold or pollen bother you and go to asthma symptoms - let us know  Pulmonary infiltrate - filling defect June 2019 left upper lobe CT likely reflective of healing proceees - we can readdress ths in 1 year with CT chest or CXR  Followup 1 year and at followup we can ddecide imaging

## 2017-11-08 ENCOUNTER — Other Ambulatory Visit: Payer: Self-pay | Admitting: Physician Assistant

## 2017-11-08 ENCOUNTER — Encounter: Payer: Self-pay | Admitting: Physician Assistant

## 2017-11-08 DIAGNOSIS — F988 Other specified behavioral and emotional disorders with onset usually occurring in childhood and adolescence: Secondary | ICD-10-CM

## 2017-11-08 DIAGNOSIS — G4700 Insomnia, unspecified: Secondary | ICD-10-CM

## 2017-11-08 NOTE — Telephone Encounter (Signed)
Last OV 06/26/17, No future OV  Last filled 02/21/17, # 30 with 0 refills

## 2017-11-08 NOTE — Telephone Encounter (Signed)
Ambien last filled 02/21/2017 Adderall last filled 02/21/2017 No CSC No UDS  Please advise

## 2017-11-09 MED ORDER — ZOLPIDEM TARTRATE 10 MG PO TABS: 10.0000 mg | ORAL_TABLET | Freq: Every evening | ORAL | 0 refills | Status: DC | PRN

## 2018-04-05 ENCOUNTER — Encounter: Payer: Self-pay | Admitting: Physician Assistant

## 2018-04-05 MED ORDER — OSELTAMIVIR PHOSPHATE 75 MG PO CAPS: 75.0000 mg | ORAL_CAPSULE | Freq: Two times a day (BID) | ORAL | 0 refills | Status: DC

## 2018-06-17 ENCOUNTER — Encounter: Payer: Self-pay | Admitting: Physician Assistant

## 2018-06-17 NOTE — Telephone Encounter (Signed)
Verify to make sure I am correct but it does not look like I have prescribed these for him since 2018. He would need appointment to consider restarting

## 2018-06-18 ENCOUNTER — Encounter: Payer: Self-pay | Admitting: Physician Assistant

## 2018-06-18 ENCOUNTER — Other Ambulatory Visit: Payer: Self-pay

## 2018-06-18 ENCOUNTER — Ambulatory Visit (INDEPENDENT_AMBULATORY_CARE_PROVIDER_SITE_OTHER): Payer: 59 | Admitting: Physician Assistant

## 2018-06-18 ENCOUNTER — Other Ambulatory Visit: Payer: Self-pay | Admitting: Physician Assistant

## 2018-06-18 VITALS — HR 65 | Wt 205.0 lb

## 2018-06-18 DIAGNOSIS — G4700 Insomnia, unspecified: Secondary | ICD-10-CM

## 2018-06-18 DIAGNOSIS — F988 Other specified behavioral and emotional disorders with onset usually occurring in childhood and adolescence: Secondary | ICD-10-CM | POA: Diagnosis not present

## 2018-06-18 MED ORDER — AMPHETAMINE-DEXTROAMPHETAMINE 10 MG PO TABS: 10.0000 mg | ORAL_TABLET | Freq: Every day | ORAL | 0 refills | Status: DC

## 2018-06-18 NOTE — Progress Notes (Signed)
I have discussed the procedure for the virtual visit with the patient who has given consent to proceed with assessment and treatment.   Sterling Ucci S Arlina Sabina, CMA     

## 2018-06-18 NOTE — Assessment & Plan Note (Signed)
Will restart Adderall at 20 mg dose. Patient to take daily as directed. Follow-up via MyChart in 2 weeks so we can make changes. Will plan on Video follow-up in 4 weeks. CSC sent to MyChart for signature.

## 2018-06-18 NOTE — Telephone Encounter (Signed)
Ambien last rx 11/09/17 #15.

## 2018-06-18 NOTE — Patient Instructions (Signed)
Instructions sent to MyChart.  Please take the new dose of Adderall daily as directed. Please let me know how this is working for you. Send me a MyChart message in 10-14 days letting me know!  If you note any sleeping difficulty, significant decrease in appetite or other issue, stop the medication and let me know.  We will plan on following up via Video Visit in 1 month.  Don't forget to complete and upload your contract ASAP.  Take care!

## 2018-06-18 NOTE — Progress Notes (Signed)
Virtual Visit via Video Note  I connected with Mataeo Persing on 06/18/18 at  8:30 AM EDT by a video enabled telemedicine application and verified that I am speaking with the correct person using two identifiers.   I discussed the limitations of evaluation and management by telemedicine and the availability of in person appointments. The patient expressed understanding and agreed to proceed.  History of Present Illness: Patient presents via WebEx today to follow-up in ADD. Patient with long-standing history, most recently on 10 mg Adderall XR. Has not needed in some time but now that he is trying to work from home while taking care of the kids, notes significant issue with focus. Patient endorses increased work stressors and deadlines so focus is key. Denies anorexia or insomnia. Patient denies chest pain, palpitations, lightheadedness, dizziness, vision changes or frequent headaches.    Observations/Objective: Patient is well-developed, well-nourished in no acute distress.  Resting comfortably in desk chair at home.   Head is normocephalic, atraumatic.  No labored breathing.  Speech is clear and coherent with logical contest.  Patient is alert and oriented at baseline.   Assessment and Plan: Attention deficit disorder Will restart Adderall at 20 mg dose. Patient to take daily as directed. Follow-up via MyChart in 2 weeks so we can make changes. Will plan on Video follow-up in 4 weeks. CSC sent to MyChart for signature.  Follow Up Instructions:   I discussed the assessment and treatment plan with the patient. The patient was provided an opportunity to ask questions and all were answered. The patient agreed with the plan and demonstrated an understanding of the instructions.   The patient was advised to call back or seek an in-person evaluation if the symptoms worsen or if the condition fails to improve as anticipated.    Piedad Climes, PA-C

## 2018-06-26 ENCOUNTER — Encounter: Payer: Self-pay | Admitting: Physician Assistant

## 2018-06-28 ENCOUNTER — Telehealth: Payer: Self-pay | Admitting: Emergency Medicine

## 2018-06-28 NOTE — Telephone Encounter (Signed)
LMOVM advising patient to call back to verify insurance information. We are trying to do prior authorization for his Adderall medication.

## 2018-07-02 NOTE — Telephone Encounter (Signed)
Patient called back today with updated insurance information. Updated in his chart. PA started for the Adderall for the [employer]. Key: [id number] - PA Case ID: [id number] Waiting on response from insurance co.

## 2018-07-03 NOTE — Telephone Encounter (Signed)
Adderall approved thru [facility] 07/02/18-07/02/19 Faxed approval to pharmacy

## 2018-07-27 IMAGING — CT CT CHEST W/O CM
2 of 3 series · 15 of 36 positions shown, 18 images · non-contrast
Comparison: 05/13/2017

CLINICAL DATA: Follow-up mucoid impaction.

EXAM:
CT CHEST WITHOUT CONTRAST
TECHNIQUE: Multidetector CT imaging of the chest was performed following the
standard protocol without IV contrast.

[Series 2: thorax · axial · 0.83mm/px · z∈[-329,-51]mm · 12 of 165 slices shown, 15 images]
[im 13/165  mediastinal]
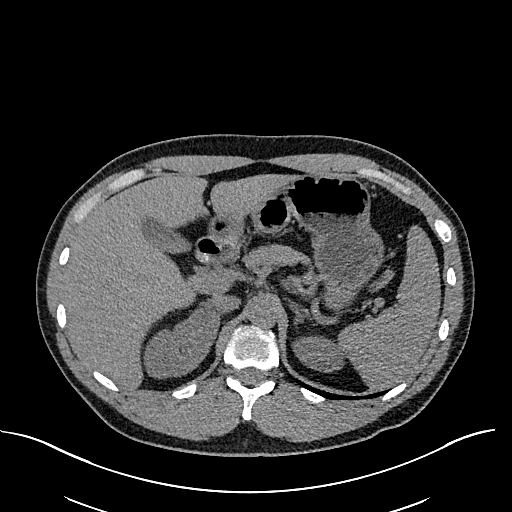
[im 13/165  lung]
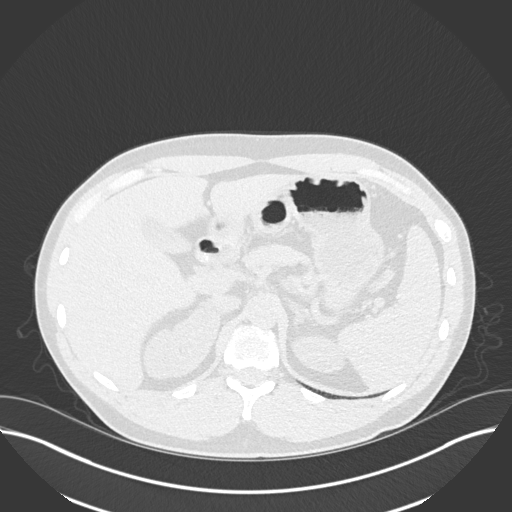
[im 25/165  lung]
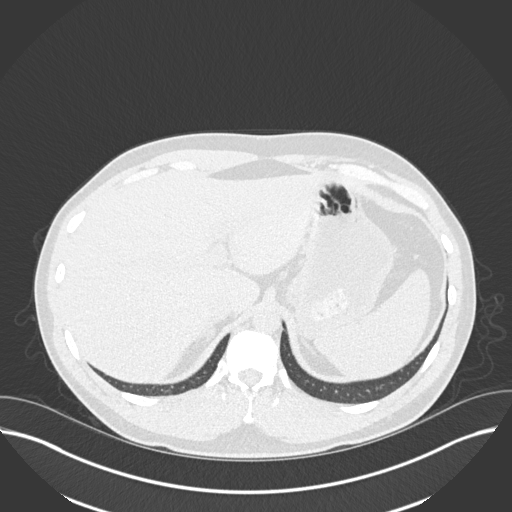
[im 37/165  lung]
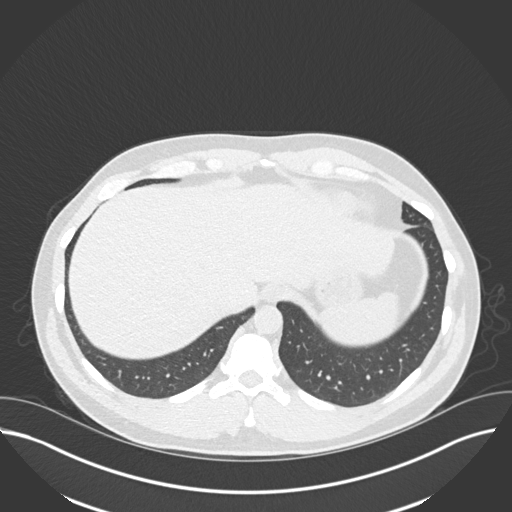
[im 49/165  lung]
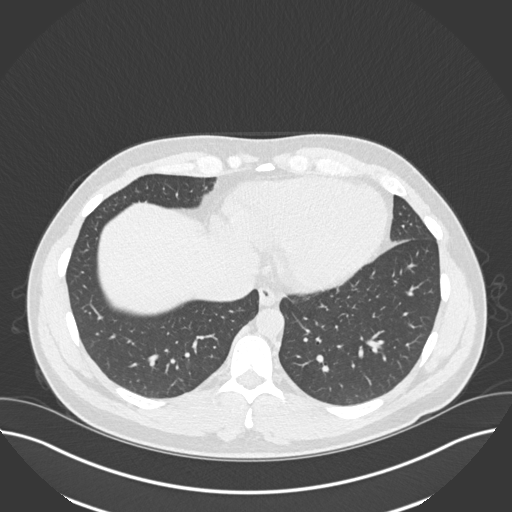
[im 61/165  mediastinal]
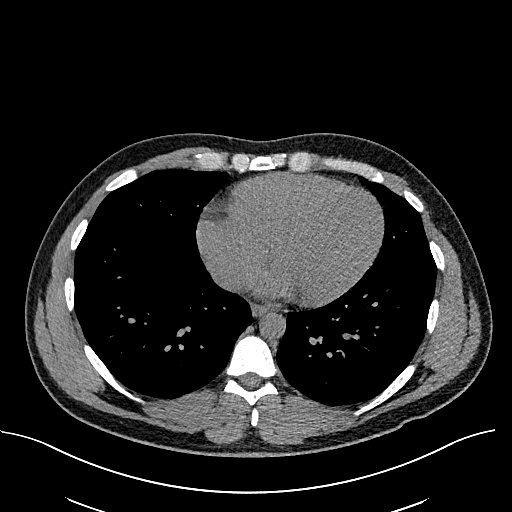
[im 61/165  lung]
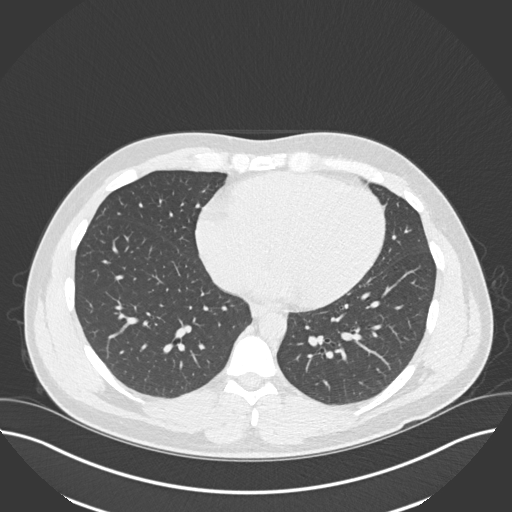
[im 73/165  lung]
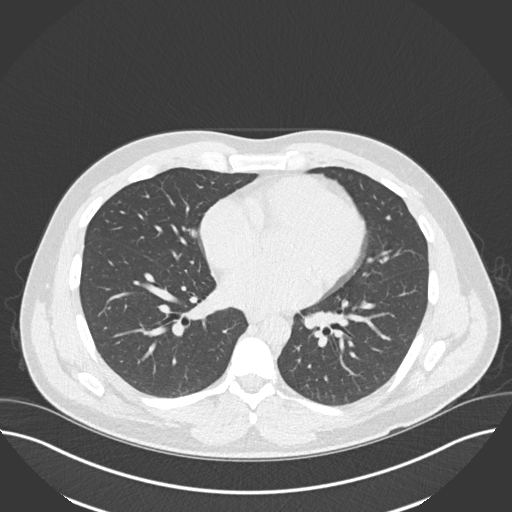
[im 92/165  lung]
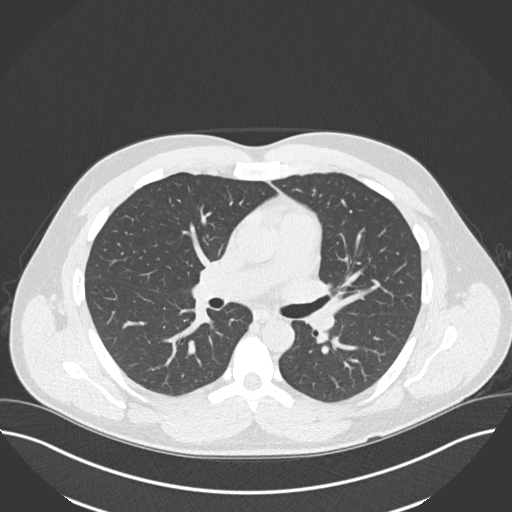
[im 104/165  lung]
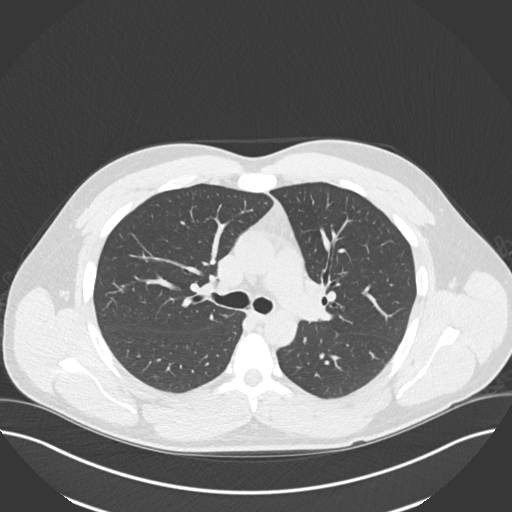
[im 116/165  mediastinal]
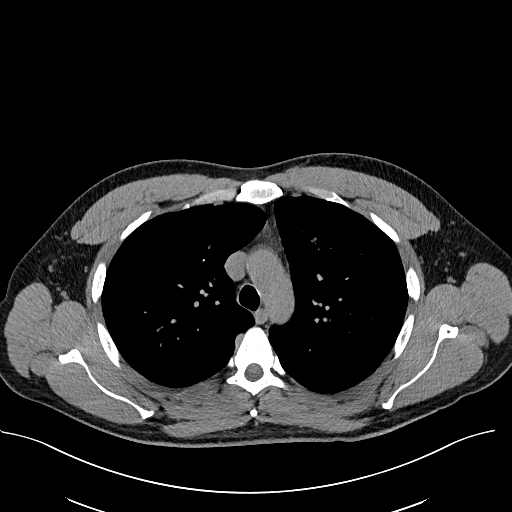
[im 116/165  lung]
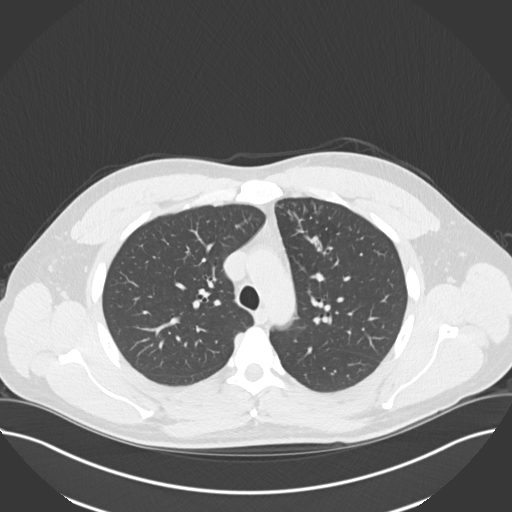
[im 128/165  lung]
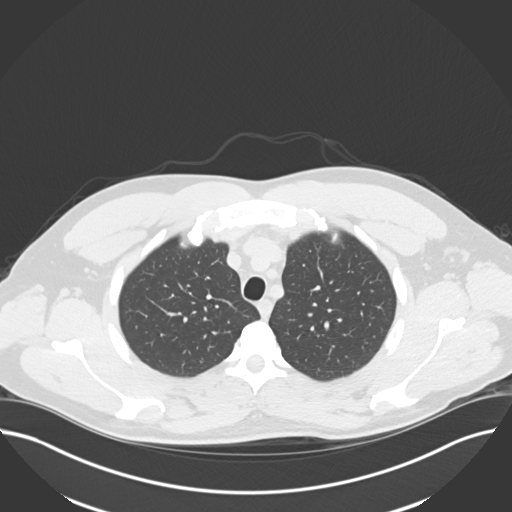
[im 140/165  lung]
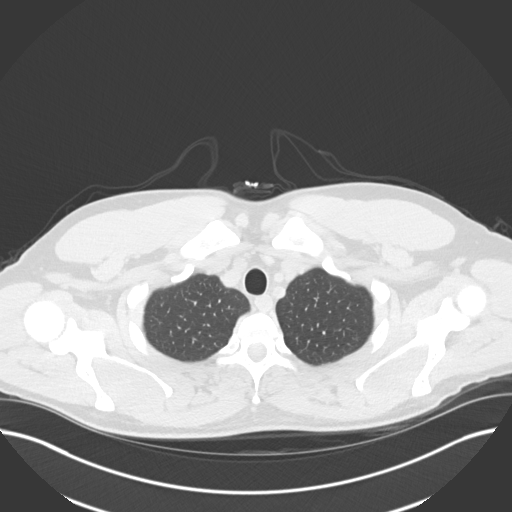
[im 152/165  lung]
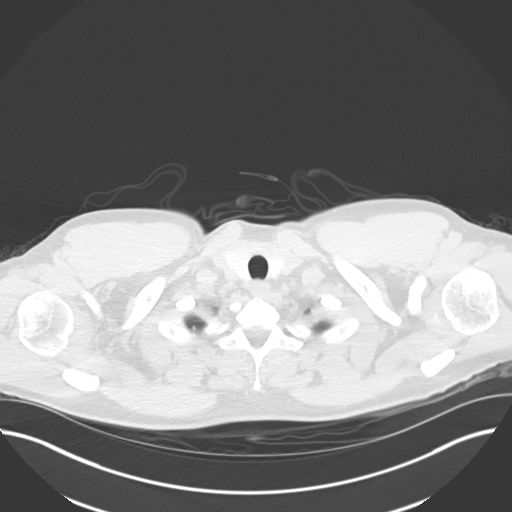

[Series 5: coronal · coronal · 0.64mm/px · 3 of 123 slices shown]
[im 25/123  lung]
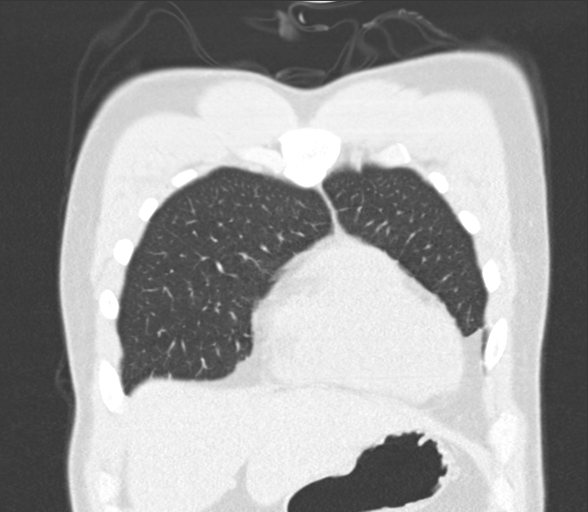
[im 49/123  lung]
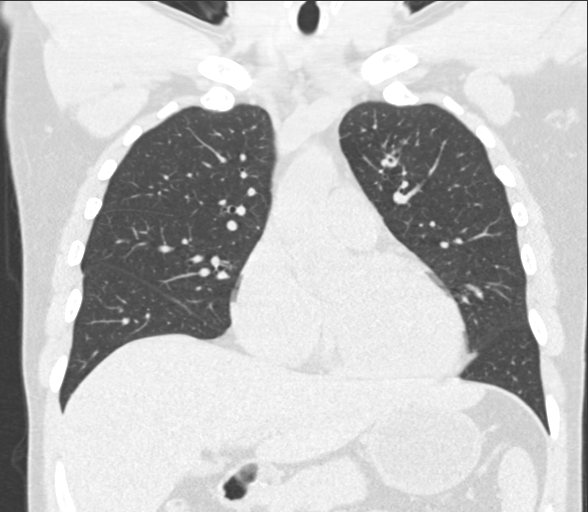
[im 74/123  lung]
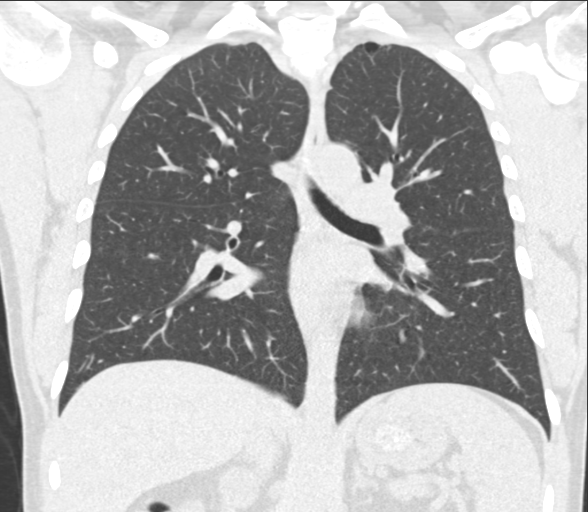

[15 of 36 positions shown; findings below may reference images not displayed]

FINDINGS: Cardiovascular: The heart size appears normal. No pericardial
effusion.

Mediastinum/Nodes: No enlarged mediastinal or axillary lymph nodes.
Thyroid gland, trachea, and esophagus demonstrate no significant
findings.

Lungs/Pleura: No pleural effusion identified. No airspace
consolidation, atelectasis or pneumothorax. Focal area of
bronchiectasis within the anterior medial left upper lobe is
identified with mild bronchial wall thickening. This corresponds to
the previously noted area of mucoid impaction. There is a residual
endobronchial filling defect measuring 7 mm, image 56/5 and image
59/3. No new pulmonary nodules or masses identified.

Upper Abdomen: No acute findings identified. There is a intermediate
attenuating exophytic lesion arising from upper pole of right kidney
measuring 2.6 cm, image 75/5. This is incompletely characterized
without IV contrast.

Musculoskeletal: No chest wall mass or suspicious bone lesions
identified.
IMPRESSION: 1. Near complete resolution of previously noted mucoid impaction
within dilated left upper lobe bronchi. Residual 7 mm endobronchial
filling defect is identified on the current exam, nonspecific.
Consider follow-up imaging in 3-6 months to ensure complete
resolution and to rule out underlying lesion.

## 2018-07-31 ENCOUNTER — Encounter: Payer: Self-pay | Admitting: Physician Assistant

## 2018-08-19 ENCOUNTER — Encounter: Payer: Self-pay | Admitting: Physician Assistant

## 2018-08-21 MED ORDER — AMPHETAMINE-DEXTROAMPHETAMINE 20 MG PO TABS: 20.0000 mg | ORAL_TABLET | Freq: Every day | ORAL | 0 refills | Status: DC

## 2018-11-04 ENCOUNTER — Other Ambulatory Visit: Payer: Self-pay

## 2018-11-04 DIAGNOSIS — Z20822 Contact with and (suspected) exposure to covid-19: Secondary | ICD-10-CM

## 2018-11-05 LAB — NOVEL CORONAVIRUS, NAA: SARS-CoV-2, NAA: NOT DETECTED

## 2020-02-17 DIAGNOSIS — Z20822 Contact with and (suspected) exposure to covid-19: Secondary | ICD-10-CM | POA: Diagnosis not present

## 2020-02-22 DIAGNOSIS — Z20822 Contact with and (suspected) exposure to covid-19: Secondary | ICD-10-CM | POA: Diagnosis not present

## 2020-02-22 DIAGNOSIS — S63619A Unspecified sprain of unspecified finger, initial encounter: Secondary | ICD-10-CM | POA: Diagnosis not present

## 2020-04-06 ENCOUNTER — Encounter: Payer: Self-pay | Admitting: Physician Assistant

## 2020-04-06 ENCOUNTER — Ambulatory Visit (INDEPENDENT_AMBULATORY_CARE_PROVIDER_SITE_OTHER): Payer: BC Managed Care – PPO | Admitting: Physician Assistant

## 2020-04-06 ENCOUNTER — Other Ambulatory Visit: Payer: Self-pay

## 2020-04-06 VITALS — BP 120/78 | HR 86 | Temp 98.2°F | Resp 16 | Ht 72.0 in | Wt 205.0 lb

## 2020-04-06 DIAGNOSIS — F988 Other specified behavioral and emotional disorders with onset usually occurring in childhood and adolescence: Secondary | ICD-10-CM

## 2020-04-06 DIAGNOSIS — F5101 Primary insomnia: Secondary | ICD-10-CM | POA: Diagnosis not present

## 2020-04-06 DIAGNOSIS — Z0000 Encounter for general adult medical examination without abnormal findings: Secondary | ICD-10-CM

## 2020-04-06 DIAGNOSIS — Z0001 Encounter for general adult medical examination with abnormal findings: Secondary | ICD-10-CM

## 2020-04-06 DIAGNOSIS — Z1159 Encounter for screening for other viral diseases: Secondary | ICD-10-CM | POA: Diagnosis not present

## 2020-04-06 DIAGNOSIS — S6992XA Unspecified injury of left wrist, hand and finger(s), initial encounter: Secondary | ICD-10-CM

## 2020-04-06 MED ORDER — ALPRAZOLAM 1 MG PO TABS: 0.5000 mg | ORAL_TABLET | Freq: Every day | ORAL | 1 refills | Status: DC

## 2020-04-06 MED ORDER — AMPHETAMINE-DEXTROAMPHETAMINE 20 MG PO TABS: 20.0000 mg | ORAL_TABLET | Freq: Every day | ORAL | 0 refills | Status: DC

## 2020-04-06 NOTE — Patient Instructions (Signed)
Please go to the lab for blood work.   Our office will call you with your results unless you have chosen to receive results via MyChart.  If your blood work is normal we will follow-up each year for physicals and as scheduled for chronic medical problems.  If anything is abnormal we will treat accordingly and get you in for a follow-up.  Start the Alprazolam as directed at night when needed for insomnia.  Review the recommendations below.   Sleep Hygiene  Do: (1) Go to bed at the same time each day. (2) Get up from bed at the same time each day. (3) Get regular exercise each day, preferably in the morning.  There is goof evidence that regular exercise improves restful sleep.  This includes stretching and aerobic exercise. (4) Get regular exposure to outdoor or bright lights, especially in the late afternoon. (5) Keep the temperature in your bedroom comfortable. (6) Keep the bedroom quiet when sleeping. (7) Keep the bedroom dark enough to facilitate sleep. (8) Use your bed only for sleep and sex. (9) Take medications as directed.  It is helpful to take prescribed sleeping pills 1 hour before bedtime, so they are causing drowsiness when you lie down, or 10 hours before getting up, to avoid daytime drowsiness. (10) Use a relaxation exercise just before going to sleep -- imagery, massage, warm bath. (11) Keep your feet and hands warm.  Wear warm socks and/or mittens or gloves to bed.  Don't: (1) Exercise just before going to bed. (2) Engage in stimulating activity just before bed, such as playing a competitive game, watching an exciting program on television, or having an important discussion with a loved one. (3) Have caffeine in the evening (coffee, teas, chocolate, sodas, etc.) (4) Read or watch television in bed. (5) Use alcohol to help you sleep. (6) Go to bed too hungry or too full. (7) Take another person's sleeping pills. (8) Take over-the-counter sleeping pills, without your  doctor's knowledge.  Tolerance can develop rapidly with these medications.  Diphenhydramine can have serious side effects for elderly patients. (9) Take daytime naps. (10) Command yourself to go to sleep.  This only makes your mind and body more alert.  If you lie awake for more than 20-30 minutes, get up, go to a different room, participate in a quiet activity (Ex - non-excitable reading or television), and then return to bed when you feel sleepy.  Do this as many times during the night as needed.  This may cause you to have a night or two of poor sleep but it will train your brain to know when it is time for sleep.   Preventive Care 50-65 Years Old, Male Preventive care refers to lifestyle choices and visits with your health care provider that can promote health and wellness. This includes:  A yearly physical exam. This is also called an annual wellness visit.  Regular dental and eye exams.  Immunizations.  Screening for certain conditions.  Healthy lifestyle choices, such as: ? Eating a healthy diet. ? Getting regular exercise. ? Not using drugs or products that contain nicotine and tobacco. ? Limiting alcohol use. What can I expect for my preventive care visit? Physical exam Your health care provider may check your:  Height and weight. These may be used to calculate your BMI (body mass index). BMI is a measurement that tells if you are at a healthy weight.  Heart rate and blood pressure.  Body temperature.  Skin for abnormal spots.  Counseling Your health care provider may ask you questions about your:  Past medical problems.  Family's medical history.  Alcohol, tobacco, and drug use.  Emotional well-being.  Home life and relationship well-being.  Sexual activity.  Diet, exercise, and sleep habits.  Work and work Astronomer.  Access to firearms. What immunizations do I need? Vaccines are usually given at various ages, according to a schedule. Your health  care provider will recommend vaccines for you based on your age, medical history, and lifestyle or other factors, such as travel or where you work.   What tests do I need? Blood tests  Lipid and cholesterol levels. These may be checked every 5 years starting at age 62.  Hepatitis C test.  Hepatitis B test. Screening  Diabetes screening. This is done by checking your blood sugar (glucose) after you have not eaten for a while (fasting).  Genital exam to check for testicular cancer or hernias.  STD (sexually transmitted disease) testing, if you are at risk. Talk with your health care provider about your test results, treatment options, and if necessary, the need for more tests.   Follow these instructions at home: Eating and drinking  Eat a healthy diet that includes fresh fruits and vegetables, whole grains, lean protein, and low-fat dairy products.  Drink enough fluid to keep your urine pale yellow.  Take vitamin and mineral supplements as recommended by your health care provider.  Do not drink alcohol if your health care provider tells you not to drink.  If you drink alcohol: ? Limit how much you have to 0-2 drinks a day. ? Be aware of how much alcohol is in your drink. In the [place] one drink equals one 12 oz bottle of beer (355 mL), one 5 oz glass of wine (148 mL), or one 1 oz glass of hard liquor (44 mL).   Lifestyle  Take daily care of your teeth and gums. Brush your teeth every morning and night with fluoride toothpaste. Floss one time each day.  Stay active. Exercise for at least 30 minutes 5 or more days each week.  Do not use any products that contain nicotine or tobacco, such as cigarettes, e-cigarettes, and chewing tobacco. If you need help quitting, ask your health care provider.  Do not use drugs.  If you are sexually active, practice safe sex. Use a condom or other form of protection to prevent STIs (sexually transmitted infections).  Find healthy ways to cope  with stress, such as: ? Meditation, yoga, or listening to music. ? Journaling. ? Talking to a trusted person. ? Spending time with friends and family. Safety  Always wear your seat belt while driving or riding in a vehicle.  Do not drive: ? If you have been drinking alcohol. Do not ride with someone who has been drinking. ? When you are tired or distracted. ? While texting.  Wear a helmet and other protective equipment during sports activities.  If you have firearms in your house, make sure you follow all gun safety procedures.  Seek help if you have been physically or sexually abused. What's next?  Go to your health care provider once a year for an annual wellness visit.  Ask your health care provider how often you should have your eyes and teeth checked.  Stay up to date on all vaccines. This information is not intended to replace advice given to you by your health care provider. Make sure you discuss any questions you have with your health care  provider. Document Revised: 11/13/2018 Document Reviewed: 02/21/2018 Elsevier Patient Education  2021 [employer].

## 2020-04-06 NOTE — Progress Notes (Signed)
Patient presents to clinic today for annual exam.  Patient is fasting for labs.  Acute Concerns: Endorses jamming his left ring finger in a door over a month ago. Notes swelling of his PIP joint that has remained. Notes some limited ROM with intermittent pain.   Chronic Issues: ADD -- On Adderall 20 mg daily PRN on long business days. Has worked very well for him. Uses maybe once weekly at present.   Insomnia -- Previously on Ambien which helped him before but has excessive grogginess in the morning when he took the medication. Now with an earlier start, he knows this is not a good option for him. Has taken Alprazolam before which he noted helped with sleep but was short-acting enough not to make him feel bad in the morning.    Health Maintenance: Immunizations -- Declines flu. TDaP up-to-date.   Past Medical History:  Diagnosis Date  . ADHD (attention deficit hyperactivity disorder)    Mild    Past Surgical History:  Procedure Laterality Date  . NO PAST SURGERIES      No current outpatient medications on file prior to visit.   No current facility-administered medications on file prior to visit.    No Known Allergies  Family History  Problem Relation Age of Onset  . Multiple sclerosis Mother   . Cancer Father 80       Prostate  . Hyperlipidemia Father     Social History   Socioeconomic History  . Marital status: Single    Spouse name: Not on file  . Number of children: 1  . Years of education: Not on file  . Highest education level: Not on file  Occupational History  . Occupation: Public relations account executive  Tobacco Use  . Smoking status: Never Smoker  . Smokeless tobacco: Never Used  Vaping Use  . Vaping Use: Never used  Substance and Sexual Activity  . Alcohol use: Yes    Alcohol/week: 0.0 standard drinks  . Drug use: No  . Sexual activity: Yes  Other Topics Concern  . Not on file  Social History Narrative  . Not on file   Social Determinants of Health    Financial Resource Strain: Not on file  Food Insecurity: Not on file  Transportation Needs: Not on file  Physical Activity: Not on file  Stress: Not on file  Social Connections: Not on file  Intimate Partner Violence: Not on file   Review of Systems  Constitutional: Negative for fever and weight loss.  HENT: Negative for ear discharge, ear pain, hearing loss and tinnitus.   Eyes: Negative for blurred vision, double vision, photophobia and pain.  Respiratory: Negative for cough and shortness of breath.   Cardiovascular: Negative for chest pain and palpitations.  Gastrointestinal: Negative for abdominal pain, blood in stool, constipation, diarrhea, heartburn, melena, nausea and vomiting.  Genitourinary: Negative for dysuria, flank pain, frequency, hematuria and urgency.  Musculoskeletal: Negative for falls.  Neurological: Negative for dizziness, loss of consciousness and headaches.  Endo/Heme/Allergies: Negative for environmental allergies.  Psychiatric/Behavioral: Negative for depression, hallucinations, substance abuse and suicidal ideas. The patient is not nervous/anxious and does not have insomnia.    BP 120/78   Pulse 86   Temp 98.2 F (36.8 C) (Temporal)   Resp 16   Ht 6' (1.829 m)   Wt 205 lb (93 kg)   SpO2 99%   BMI 27.80 kg/m   Physical Exam Vitals reviewed.  Constitutional:      General: He is not  in acute distress.    Appearance: He is well-developed and well-nourished. He is not diaphoretic.  HENT:     Head: Normocephalic and atraumatic.     Right Ear: Tympanic membrane, ear canal and external ear normal.     Left Ear: Tympanic membrane, ear canal and external ear normal.     Nose: Nose normal.     Mouth/Throat:     Mouth: Oropharynx is clear and moist and mucous membranes are normal.     Pharynx: No posterior oropharyngeal edema or posterior oropharyngeal erythema.  Eyes:     Conjunctiva/sclera: Conjunctivae normal.     Pupils: Pupils are equal, round,  and reactive to light.  Neck:     Thyroid: No thyromegaly.  Cardiovascular:     Rate and Rhythm: Normal rate and regular rhythm.     Pulses: Intact distal pulses.     Heart sounds: Normal heart sounds.  Pulmonary:     Effort: Pulmonary effort is normal. No respiratory distress.     Breath sounds: Normal breath sounds. No wheezing or rales.  Chest:     Chest wall: No tenderness.  Abdominal:     General: Bowel sounds are normal. There is no distension.     Palpations: Abdomen is soft. There is no mass.     Tenderness: There is no abdominal tenderness. There is no guarding or rebound.  Musculoskeletal:     Left hand: Deformity (enlargement of the PIP of 4th finger. Pain with extension of distal finger) present.     Cervical back: Neck supple.  Lymphadenopathy:     Cervical: No cervical adenopathy.  Skin:    General: Skin is warm and dry.     Findings: No rash.  Neurological:     Mental Status: He is alert and oriented to person, place, and time.     Cranial Nerves: No cranial nerve deficit.  Psychiatric:        Mood and Affect: Mood and affect normal.    Assessment/Plan: 1. Visit for preventive health examination Depression screen negative. Health Maintenance reviewed. Preventive schedule discussed and handout given in AVS. Will obtain fasting labs today.  - CBC with Differential/Platelet - Comprehensive metabolic panel - Lipid panel  2. Attention deficit disorder (ADD) without hyperactivity Will continue Adderall at current dose.   3. Primary insomnia Sleep hygiene practices reviewed with patient. Handout given to patient. Will give Alprazolam 0.5-1 mg nightly PRN for sleep since it is shorter acting.  - ALPRAZolam (XANAX) 1 MG tablet; Take 0.5-1 tablets (0.5-1 mg total) by mouth at bedtime.  Dispense: 30 tablet; Refill: 1  4. Need for hepatitis C screening test - Hepatitis C Antibody  5. Injury of left ring finger, initial encounter 2 months ago. PIP double the  size of other PIP joints. Concern for prior fracture which now has healed poorly. Referral to hand specialist placed.  - Ambulatory referral to Hand Surgery   This visit occurred during the SARS-CoV-2 public health emergency.  Safety protocols were in place, including screening questions prior to the visit, additional usage of staff PPE, and extensive cleaning of exam room while observing appropriate contact time as indicated for disinfecting solutions.    Piedad Climes, PA-C

## 2020-04-07 ENCOUNTER — Encounter: Payer: Self-pay | Admitting: Physician Assistant

## 2020-04-07 LAB — LIPID PANEL
Cholesterol: 165 mg/dL (ref 0–200)
HDL: 43.6 mg/dL (ref >39.00)
LDL Cholesterol: 95 mg/dL (ref 0–99)
NonHDL: 121.79
Total CHOL/HDL Ratio: 4
Triglycerides: 134 mg/dL (ref 0.0–149.0)
VLDL: 26.8 mg/dL (ref 0.0–40.0)

## 2020-04-07 LAB — COMPREHENSIVE METABOLIC PANEL
ALT: 21 U/L (ref 0–53)
AST: 20 U/L (ref 0–37)
Albumin: 4.9 g/dL (ref 3.5–5.2)
Alkaline Phosphatase: 78 U/L (ref 39–117)
BUN: 21 mg/dL (ref 6–23)
CO2: 28 mEq/L (ref 19–32)
Calcium: 10.2 mg/dL (ref 8.4–10.5)
Chloride: 100 mEq/L (ref 96–112)
Creatinine, Ser: 1.15 mg/dL (ref 0.40–1.50)
GFR: 81.83 mL/min (ref >60.00)
Glucose, Bld: 85 mg/dL (ref 70–99)
Potassium: 4.2 mEq/L (ref 3.5–5.1)
Sodium: 136 mEq/L (ref 135–145)
Total Bilirubin: 0.3 mg/dL (ref 0.2–1.2)
Total Protein: 7.7 g/dL (ref 6.0–8.3)

## 2020-04-07 LAB — CBC WITH DIFFERENTIAL/PLATELET
Basophils Absolute: 0 10*3/uL (ref 0.0–0.1)
Basophils Relative: 0.3 % (ref 0.0–3.0)
Eosinophils Absolute: 0.1 10*3/uL (ref 0.0–0.7)
Eosinophils Relative: 0.5 % (ref 0.0–5.0)
HCT: 42.8 % (ref 39.0–52.0)
Hemoglobin: 14.7 g/dL (ref 13.0–17.0)
Lymphocytes Relative: 10.4 % — ABNORMAL LOW (ref 12.0–46.0)
Lymphs Abs: 1 10*3/uL (ref 0.7–4.0)
MCHC: 34.3 g/dL (ref 30.0–36.0)
MCV: 87.3 fl (ref 78.0–100.0)
Monocytes Absolute: 0.7 10*3/uL (ref 0.1–1.0)
Monocytes Relative: 6.8 % (ref 3.0–12.0)
Neutro Abs: 8 10*3/uL — ABNORMAL HIGH (ref 1.4–7.7)
Neutrophils Relative %: 82 % — ABNORMAL HIGH (ref 43.0–77.0)
Platelets: 268 10*3/uL (ref 150.0–400.0)
RBC: 4.9 Mil/uL (ref 4.22–5.81)
RDW: 12.7 % (ref 11.5–15.5)
WBC: 9.7 10*3/uL (ref 4.0–10.5)

## 2020-04-07 LAB — HEPATITIS C ANTIBODY
Hepatitis C Ab: NONREACTIVE
SIGNAL TO CUT-OFF: 0.01 (ref <1.00)

## 2020-04-09 ENCOUNTER — Telehealth: Payer: Self-pay | Admitting: Emergency Medicine

## 2020-04-09 ENCOUNTER — Ambulatory Visit: Payer: Self-pay

## 2020-04-09 ENCOUNTER — Other Ambulatory Visit: Payer: Self-pay

## 2020-04-09 ENCOUNTER — Ambulatory Visit (INDEPENDENT_AMBULATORY_CARE_PROVIDER_SITE_OTHER): Payer: BC Managed Care – PPO | Admitting: Orthopedic Surgery

## 2020-04-09 DIAGNOSIS — M79645 Pain in left finger(s): Secondary | ICD-10-CM | POA: Diagnosis not present

## 2020-04-09 MED ORDER — DICLOFENAC SODIUM 75 MG PO TBEC: DELAYED_RELEASE_TABLET | ORAL | 0 refills | Status: DC

## 2020-04-09 NOTE — Telephone Encounter (Signed)
PA started for patient Adderall Started on Cover My Med website Approved from 04/09/20-04/09/23

## 2020-04-10 ENCOUNTER — Encounter: Payer: Self-pay | Admitting: Orthopedic Surgery

## 2020-04-10 NOTE — Progress Notes (Signed)
Office Visit Note   Patient: Victor Martinez           Date of Birth: 1986-02-11           MRN: [id number] Visit Date: 04/09/2020 Requested by: Waldon Merl, PA-C [id number] [place],  [place] [address] PCP: Noel Journey  Subjective: Chief Complaint  Patient presents with  . Hand Pain    HPI: Victor Martinez is a 35 year old patient with left ring finger pain.  He injured the left ring finger 2 months ago when he was play fighting with another person.  He is right-hand dominant.  Describes swelling and decreased range of motion in the PIP joint of the left ring finger.  The pain wakes him from sleep at night.  Hurts him to play golf.  By history he did not have to perform any type of reduction maneuver on the finger.              ROS: All systems reviewed are negative as they relate to the chief complaint within the history of present illness.  Patient denies  fevers or chills.   Assessment & Plan: Visit Diagnoses:  1. Pain in left finger(s)     Plan: Impression is left ring finger pain.  He does have some swelling around the PIP joint but functionally he has full extension against resistance at the PIP joint as well as full flexion strength.  Does have some limitation of range of motion.  Collaterals are stable.  Radiographs unremarkable.  He has likely had a nondisplaced joint injury which has healed.  I think his finger will continue to improve but may not ever get back to normal.  No indication for any type of operative intervention at this time.  We did talk about physical therapy but we will hold off on that and try a Voltaren taper over the next 4weeks.  If he wants to try therapy after that Voltaren taper he can let me know but for now I think it will be a slow process but he should get most of his function back in that finger.  Follow-Up Instructions: Return if symptoms worsen or fail to improve.   Orders:  Orders Placed This Encounter  Procedures  . XR Finger  Ring Left   Meds ordered this encounter  Medications  . diclofenac (VOLTAREN) 75 MG EC tablet    Sig: 1 po bid x 2wks then 1 po q d x 2 wees then q d prn    Dispense:  60 tablet    Refill:  0      Procedures: No procedures performed   Clinical Data: No additional findings.  Objective: Vital Signs: There were no vitals taken for this visit.  Physical Exam:   Constitutional: Patient appears well-developed HEENT:  Head: Normocephalic Eyes:EOM are normal Neck: Normal range of motion Cardiovascular: Normal rate Pulmonary/chest: Effort normal Neurologic: Patient is alert Skin: Skin is warm Psychiatric: Patient has normal mood and affect    Ortho Exam: Ortho exam demonstrates full extension against gravity and resistance at the PIP joint of the left fourth finger.  He is lacking about 20 degrees of flexion of the PIP joint on the left compared to the right.  He has good DIP passive flexion with the PIP joint extended.  Collaterals are stable to radial and ulnar stress at 0 and 30 degrees.  He has intact flexion at both the PIP and DIP joint of that left fourth  finger.  Some swelling is present circumferentially around the joint compared to the right-hand side.  Specialty Comments:  No specialty comments available.  Imaging: No results found.   PMFS History: Patient Active Problem List   Diagnosis Date Noted  . Visit for preventive health examination 10/20/2014  . Family hx of prostate cancer 10/20/2014  . BMI 27.0-27.9,adult 10/20/2014  . Attention deficit disorder 02/06/2007   Past Medical History:  Diagnosis Date  . ADHD (attention deficit hyperactivity disorder)    Mild    Family History  Problem Relation Age of Onset  . Multiple sclerosis Mother   . Cancer Father 22       Prostate  . Hyperlipidemia Father     Past Surgical History:  Procedure Laterality Date  . NO PAST SURGERIES     Social History   Occupational History  . Occupation: Public relations account executive   Tobacco Use  . Smoking status: Never Smoker  . Smokeless tobacco: Never Used  Vaping Use  . Vaping Use: Never used  Substance and Sexual Activity  . Alcohol use: Yes    Alcohol/week: 0.0 standard drinks  . Drug use: No  . Sexual activity: Yes

## 2021-02-22 DIAGNOSIS — Z7689 Persons encountering health services in other specified circumstances: Secondary | ICD-10-CM | POA: Diagnosis not present

## 2021-02-22 DIAGNOSIS — Z13220 Encounter for screening for lipoid disorders: Secondary | ICD-10-CM | POA: Diagnosis not present

## 2021-02-22 DIAGNOSIS — Z131 Encounter for screening for diabetes mellitus: Secondary | ICD-10-CM | POA: Diagnosis not present

## 2021-02-22 DIAGNOSIS — Z2821 Immunization not carried out because of patient refusal: Secondary | ICD-10-CM | POA: Diagnosis not present

## 2021-02-22 DIAGNOSIS — G4709 Other insomnia: Secondary | ICD-10-CM | POA: Insufficient documentation

## 2021-05-03 ENCOUNTER — Telehealth: Payer: Self-pay | Admitting: Physician Assistant

## 2021-05-03 NOTE — Telephone Encounter (Signed)
Patient's mom is calling for her son to make a New patient appointment with Dr. Drue Novel. She would like to know if Dr. Drue Novel is willing to take up her son as a new patient since he also see his dad Victor Martinez (MRN: [id number]). Is this ok?

## 2021-05-03 NOTE — Telephone Encounter (Signed)
Please advise 

## 2021-05-04 NOTE — Telephone Encounter (Signed)
LVM to call back and schedule a new patient appointment.

## 2021-05-04 NOTE — Telephone Encounter (Signed)
Okay to schedule NP appt with Dr. Paz.  °

## 2021-05-04 NOTE — Telephone Encounter (Signed)
Okay to transfer to my  care.  Schedule a physical exam please

## 2021-05-20 ENCOUNTER — Encounter: Payer: Self-pay | Admitting: Internal Medicine

## 2021-05-20 ENCOUNTER — Ambulatory Visit (INDEPENDENT_AMBULATORY_CARE_PROVIDER_SITE_OTHER): Payer: BC Managed Care – PPO | Admitting: Internal Medicine

## 2021-05-20 VITALS — BP 118/78 | HR 70 | Temp 98.2°F | Resp 18 | Ht 72.0 in | Wt 214.4 lb

## 2021-05-20 DIAGNOSIS — Z0000 Encounter for general adult medical examination without abnormal findings: Secondary | ICD-10-CM

## 2021-05-20 DIAGNOSIS — F5101 Primary insomnia: Secondary | ICD-10-CM | POA: Diagnosis not present

## 2021-05-20 DIAGNOSIS — E785 Hyperlipidemia, unspecified: Secondary | ICD-10-CM

## 2021-05-20 DIAGNOSIS — F988 Other specified behavioral and emotional disorders with onset usually occurring in childhood and adolescence: Secondary | ICD-10-CM | POA: Diagnosis not present

## 2021-05-20 DIAGNOSIS — Z79899 Other long term (current) drug therapy: Secondary | ICD-10-CM

## 2021-05-20 LAB — LIPID PANEL
Cholesterol: 213 mg/dL — ABNORMAL HIGH (ref 0–200)
HDL: 44.1 mg/dL (ref >39.00)
LDL Cholesterol: 149 mg/dL — ABNORMAL HIGH (ref 0–99)
NonHDL: 168.59
Total CHOL/HDL Ratio: 5
Triglycerides: 97 mg/dL (ref 0.0–149.0)
VLDL: 19.4 mg/dL (ref 0.0–40.0)

## 2021-05-20 LAB — COMPREHENSIVE METABOLIC PANEL
ALT: 30 U/L (ref 0–53)
AST: 21 U/L (ref 0–37)
Albumin: 4.8 g/dL (ref 3.5–5.2)
Alkaline Phosphatase: 94 U/L (ref 39–117)
BUN: 17 mg/dL (ref 6–23)
CO2: 30 mEq/L (ref 19–32)
Calcium: 9.5 mg/dL (ref 8.4–10.5)
Chloride: 102 mEq/L (ref 96–112)
Creatinine, Ser: 1.12 mg/dL (ref 0.40–1.50)
GFR: 83.81 mL/min (ref >60.00)
Glucose, Bld: 88 mg/dL (ref 70–99)
Potassium: 4.3 mEq/L (ref 3.5–5.1)
Sodium: 139 mEq/L (ref 135–145)
Total Bilirubin: 0.5 mg/dL (ref 0.2–1.2)
Total Protein: 7.2 g/dL (ref 6.0–8.3)

## 2021-05-20 LAB — CBC WITH DIFFERENTIAL/PLATELET
Basophils Absolute: 0 10*3/uL (ref 0.0–0.1)
Basophils Relative: 0.5 % (ref 0.0–3.0)
Eosinophils Absolute: 0.1 10*3/uL (ref 0.0–0.7)
Eosinophils Relative: 1.3 % (ref 0.0–5.0)
HCT: 44.4 % (ref 39.0–52.0)
Hemoglobin: 14.9 g/dL (ref 13.0–17.0)
Lymphocytes Relative: 17.5 % (ref 12.0–46.0)
Lymphs Abs: 1 10*3/uL (ref 0.7–4.0)
MCHC: 33.5 g/dL (ref 30.0–36.0)
MCV: 88.2 fl (ref 78.0–100.0)
Monocytes Absolute: 0.6 10*3/uL (ref 0.1–1.0)
Monocytes Relative: 10.1 % (ref 3.0–12.0)
Neutro Abs: 4 10*3/uL (ref 1.4–7.7)
Neutrophils Relative %: 70.6 % (ref 43.0–77.0)
Platelets: 229 10*3/uL (ref 150.0–400.0)
RBC: 5.03 Mil/uL (ref 4.22–5.81)
RDW: 14 % (ref 11.5–15.5)
WBC: 5.7 10*3/uL (ref 4.0–10.5)

## 2021-05-20 LAB — TSH: TSH: 2.29 u[IU]/mL (ref 0.35–5.50)

## 2021-05-20 MED ORDER — AMPHETAMINE-DEXTROAMPHETAMINE 20 MG PO TABS: 20.0000 mg | ORAL_TABLET | Freq: Every day | ORAL | 0 refills | Status: DC

## 2021-05-20 MED ORDER — ALPRAZOLAM 1 MG PO TABS: 0.5000 mg | ORAL_TABLET | Freq: Every day | ORAL | 1 refills | Status: DC

## 2021-05-20 NOTE — Patient Instructions (Signed)
It was nice to meet you today. ? ?Please call the office if you have any questions or concerns ? ?GO TO THE LAB : Get the blood work   ? ? ?GO TO THE FRONT DESK, PLEASE SCHEDULE YOUR APPOINTMENTS ?Come back for   a physical exam in 1 year ?

## 2021-05-20 NOTE — Progress Notes (Signed)
? ?Subjective:  ? ? Patient ID: Victor Martinez, male    DOB: 10-14-1985, 36 y.o.   MRN: [id number] ? ?DOS:  05/20/2021 ?Type of visit - description: CPX, new patient to me, transferring to this location ? ? ?Patient is doing well, have no major concerns. ?He has some anxiety and ADHD. ?Has noted weight gain. ? ?Review of Systems ? ?Other than above, a 14 point review of systems is negative  ?  ? ?Past Medical History:  ?Diagnosis Date  ? ADHD (attention deficit hyperactivity disorder)   ? Mild  ? Allergies   ? Anxiety   ? Hyperlipidemia   ? Insomnia   ? ? ?Past Surgical History:  ?Procedure Laterality Date  ? NO PAST SURGERIES    ? ?Social History  ? ?Socioeconomic History  ? Marital status: Married  ?  Spouse name: Not on file  ? Number of children: 2  ? Years of education: Not on file  ? Highest education level: Not on file  ?Occupational History  ? Occupation: Public relations account executive, has his own business  ?Tobacco Use  ? Smoking status: Never  ? Smokeless tobacco: Never  ?Vaping Use  ? Vaping Use: Never used  ?Substance and Sexual Activity  ? Alcohol use: Yes  ?  Alcohol/week: 3.0 standard drinks  ?  Types: 3 Standard drinks or equivalent per week  ?  Comment: socially  ? Drug use: No  ? Sexual activity: Yes  ?Other Topics Concern  ? Not on file  ?Social History Narrative  ? 2 boys   ? ?Social Determinants of Health  ? ?Financial Resource Strain: Not on file  ?Food Insecurity: Not on file  ?Transportation Needs: Not on file  ?Physical Activity: Not on file  ?Stress: Not on file  ?Social Connections: Not on file  ?Intimate Partner Violence: Not on file  ? ? ?Current Outpatient Medications  ?Medication Instructions  ? ALPRAZolam (XANAX) 0.5-1 mg, Oral, Daily at bedtime  ? amphetamine-dextroamphetamine (ADDERALL) 20 MG tablet 20 mg, Oral, Daily  ? ? ?   ?Objective:  ? Physical Exam ?BP 118/78 (BP Location: Left Arm, Patient Position: Sitting, Cuff Size: Normal)   Pulse 70   Temp 98.2 ?F (36.8 ?C) (Oral)   Resp 18   Ht 6'  (1.829 m)   Wt 214 lb 6 oz (97.2 kg)   SpO2 98%   BMI 29.07 kg/m?  ?General: ?Well developed, NAD, BMI noted ?Neck: No  thyromegaly  ?HEENT:  ?Normocephalic . Face symmetric, atraumatic ?Lungs:  ?CTA B ?Normal respiratory effort, no intercostal retractions, no accessory muscle use. ?Heart: RRR,  no murmur.  ?Abdomen:  ?Not distended, soft, non-tender. No rebound or rigidity.   ?Lower extremities: no pretibial edema bilaterally  ?Skin: Exposed areas without rash. Not pale. Not jaundice ?Neurologic:  ?alert & oriented X3.  ?Speech normal, gait appropriate for age and unassisted ?Strength symmetric and appropriate for age.  ?Psych: ?Cognition and judgment appear intact.  ?Cooperative with normal attention span and concentration.  ?Behavior appropriate. ?No anxious or depressed appearing. ? ?   ?Assessment   ? ?Assessment (new patient 05-2021 ) ?Anxiety ?ADHD ? ?Plan: ?(new patient 05-2021, transferring to this location, father is Kenji Mapel) ?Here for CPX ?Anxiety: The patient has 2 jobs , owns a  business, sometimes is hard to sleep, takes Xanax rarely.  PDMP okay, RF sent ?ADHD: Takes Adderall very rarely, PDMP okay, RF sent. ?RTC 1 year ? ? ?This visit occurred during the SARS-CoV-2 public health emergency.  Safety protocols were in place, including screening questions prior to the visit, additional usage of staff PPE, and extensive cleaning of exam room while observing appropriate contact time as indicated for disinfecting solutions.  ? ?

## 2021-05-21 ENCOUNTER — Encounter: Payer: Self-pay | Admitting: Internal Medicine

## 2021-05-21 DIAGNOSIS — Z09 Encounter for follow-up examination after completed treatment for conditions other than malignant neoplasm: Secondary | ICD-10-CM | POA: Insufficient documentation

## 2021-05-21 NOTE — Assessment & Plan Note (Signed)
?  Tdap 2018 ?Covid vax: booster rec  ?Flu shot: rec shots every year ?Labs: CMP, FLP, TSH, CBC ?Lifestyle: Has gained weight lately, he is determined to do better, encourage healthy diet and increase physical activity. ?

## 2021-05-21 NOTE — Assessment & Plan Note (Signed)
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  PDMP okay, RF sent ?ADHD: Takes Adderall very rarely, PDMP okay, RF sent. ?RTC 1 year ?

## 2021-05-22 LAB — DRUG MONITORING PANEL 375977 , URINE
Alcohol Metabolites: POSITIVE ng/mL — AB (ref <500)
Amphetamines: NEGATIVE ng/mL (ref <500)
Barbiturates: NEGATIVE ng/mL (ref <300)
Benzodiazepines: NEGATIVE ng/mL (ref <100)
Cocaine Metabolite: NEGATIVE ng/mL (ref <150)
Desmethyltramadol: NEGATIVE ng/mL (ref <100)
Ethyl Glucuronide (ETG): 5021 ng/mL — ABNORMAL HIGH (ref <500)
Ethyl Sulfate (ETS): 1145 ng/mL — ABNORMAL HIGH (ref <100)
Marijuana Metabolite: NEGATIVE ng/mL (ref <20)
Opiates: NEGATIVE ng/mL (ref <100)
Oxycodone: NEGATIVE ng/mL (ref <100)
Tramadol: NEGATIVE ng/mL (ref <100)

## 2021-05-22 LAB — DM TEMPLATE

## 2021-05-23 ENCOUNTER — Encounter: Payer: Self-pay | Admitting: Internal Medicine

## 2022-05-23 ENCOUNTER — Encounter: Payer: Self-pay | Admitting: Internal Medicine

## 2022-05-23 ENCOUNTER — Ambulatory Visit (INDEPENDENT_AMBULATORY_CARE_PROVIDER_SITE_OTHER): Payer: BC Managed Care – PPO | Admitting: Internal Medicine

## 2022-05-23 VITALS — BP 116/66 | HR 62 | Temp 98.0°F | Resp 16 | Ht 72.0 in | Wt 231.1 lb

## 2022-05-23 DIAGNOSIS — E785 Hyperlipidemia, unspecified: Secondary | ICD-10-CM

## 2022-05-23 DIAGNOSIS — F988 Other specified behavioral and emotional disorders with onset usually occurring in childhood and adolescence: Secondary | ICD-10-CM | POA: Diagnosis not present

## 2022-05-23 DIAGNOSIS — F5101 Primary insomnia: Secondary | ICD-10-CM

## 2022-05-23 DIAGNOSIS — Z8042 Family history of malignant neoplasm of prostate: Secondary | ICD-10-CM | POA: Diagnosis not present

## 2022-05-23 DIAGNOSIS — Z0000 Encounter for general adult medical examination without abnormal findings: Secondary | ICD-10-CM

## 2022-05-23 DIAGNOSIS — Z79899 Other long term (current) drug therapy: Secondary | ICD-10-CM

## 2022-05-23 LAB — COMPREHENSIVE METABOLIC PANEL
ALT: 51 U/L (ref 0–53)
AST: 31 U/L (ref 0–37)
Albumin: 4.8 g/dL (ref 3.5–5.2)
Alkaline Phosphatase: 103 U/L (ref 39–117)
BUN: 19 mg/dL (ref 6–23)
CO2: 29 mEq/L (ref 19–32)
Calcium: 10.2 mg/dL (ref 8.4–10.5)
Chloride: 100 mEq/L (ref 96–112)
Creatinine, Ser: 1.1 mg/dL (ref 0.40–1.50)
GFR: 85.04 mL/min (ref >60.00)
Glucose, Bld: 88 mg/dL (ref 70–99)
Potassium: 4 mEq/L (ref 3.5–5.1)
Sodium: 139 mEq/L (ref 135–145)
Total Bilirubin: 0.6 mg/dL (ref 0.2–1.2)
Total Protein: 7.5 g/dL (ref 6.0–8.3)

## 2022-05-23 LAB — CBC WITH DIFFERENTIAL/PLATELET
Basophils Absolute: 0 10*3/uL (ref 0.0–0.1)
Basophils Relative: 0.5 % (ref 0.0–3.0)
Eosinophils Absolute: 0.1 10*3/uL (ref 0.0–0.7)
Eosinophils Relative: 1.4 % (ref 0.0–5.0)
HCT: 44.6 % (ref 39.0–52.0)
Hemoglobin: 15.2 g/dL (ref 13.0–17.0)
Lymphocytes Relative: 26.9 % (ref 12.0–46.0)
Lymphs Abs: 1.4 10*3/uL (ref 0.7–4.0)
MCHC: 34.1 g/dL (ref 30.0–36.0)
MCV: 89.3 fl (ref 78.0–100.0)
Monocytes Absolute: 0.5 10*3/uL (ref 0.1–1.0)
Monocytes Relative: 8.9 % (ref 3.0–12.0)
Neutro Abs: 3.3 10*3/uL (ref 1.4–7.7)
Neutrophils Relative %: 62.3 % (ref 43.0–77.0)
Platelets: 268 10*3/uL (ref 150.0–400.0)
RBC: 4.99 Mil/uL (ref 4.22–5.81)
RDW: 13.5 % (ref 11.5–15.5)
WBC: 5.2 10*3/uL (ref 4.0–10.5)

## 2022-05-23 LAB — PSA: PSA: 1.23 ng/mL (ref 0.10–4.00)

## 2022-05-23 MED ORDER — AMPHETAMINE-DEXTROAMPHETAMINE 20 MG PO TABS: 20.0000 mg | ORAL_TABLET | Freq: Every day | ORAL | 0 refills | Status: DC

## 2022-05-23 MED ORDER — ALPRAZOLAM 1 MG PO TABS: 0.5000 mg | ORAL_TABLET | Freq: Every day | ORAL | 1 refills | Status: DC

## 2022-05-23 NOTE — Progress Notes (Unsigned)
Subjective:    Patient ID: Victor Martinez, male    DOB: [birth year], 37 y.o.   MRN: [id number]  DOS:  05/23/2022 Type of visit - description: CPX  Here for CPX In general feeling good. Has not been able to exercise or eat as healthy as before.   Wt Readings from Last 3 Encounters:  05/23/22 231 lb 2 oz (104.8 kg)  05/20/21 214 lb 6 oz (97.2 kg)  04/06/20 205 lb (93 kg)    Review of Systems  Other than above, a 14 point review of systems is negative     Past Medical History:  Diagnosis Date   ADHD (attention deficit hyperactivity disorder)    Mild   Allergies    Anxiety    Hyperlipidemia    Insomnia     Past Surgical History:  Procedure Laterality Date   NO PAST SURGERIES     Social History   Socioeconomic History   Marital status: Married    Spouse name: Not on file   Number of children: 2   Years of education: Not on file   Highest education level: Not on file  Occupational History   Occupation: Engineer, production, has his own business  Tobacco Use   Smoking status: Never   Smokeless tobacco: Never  Vaping Use   Vaping Use: Never used  Substance and Sexual Activity   Alcohol use: Yes    Alcohol/week: 3.0 standard drinks of alcohol    Types: 3 Standard drinks or equivalent per week    Comment: socially   Drug use: No   Sexual activity: Yes  Other Topics Concern   Not on file  Social History Narrative   2 boys    Social Determinants of Health   Financial Resource Strain: Not on file  Food Insecurity: Not on file  Transportation Needs: Not on file  Physical Activity: Not on file  Stress: Not on file  Social Connections: Not on file  Intimate Partner Violence: Not on file    Current Outpatient Medications  Medication Instructions   ALPRAZolam (XANAX) 0.5-1 mg, Oral, Daily at bedtime   amphetamine-dextroamphetamine (ADDERALL) 20 MG tablet 20 mg, Oral, Daily       Objective:   Physical Exam BP 116/66   Pulse 62   Temp 98 F (36.7 C) (Oral)    Resp 16   Ht 6' (1.829 m)   Wt 231 lb 2 oz (104.8 kg)   SpO2 98%   BMI 31.35 kg/m  General: Well developed, NAD, BMI noted Neck: No  thyromegaly  HEENT:  Normocephalic . Face symmetric, atraumatic Lungs:  CTA B Normal respiratory effort, no intercostal retractions, no accessory muscle use. Heart: RRR,  no murmur.  Abdomen:  Not distended, soft, non-tender. No rebound or rigidity.   Lower extremities: no pretibial edema bilaterally  Skin: Exposed areas without rash. Not pale. Not jaundice Neurologic:  alert & oriented X3.  Speech normal, gait appropriate for age and unassisted Strength symmetric and appropriate for age.  Psych: Cognition and judgment appear intact.  Cooperative with normal attention span and concentration.  Behavior appropriate. No anxious or depressed appearing.     Assessment    Assessment (new patient 05-2021 ) Hyperlipidemia Anxiety ADHD  Plan: Here for CPX Hyperlipidemia: Has a family history of high cholesterol including  his father and twin brother. His twin brother had increased LFTs when he was Rx statins. On reviewing his cholesterol, in 2022 it was very good in the context of following  a very healthy diet for 3 to 4 weeks. Plan: NMR, further advised for results, he will be reluctant to take his statins, if cholesterol is quite elevated we could recheck in 6 months or refer to the cholesterol clinic. Anxiety, ADHD: Takes medications rarely, PDMP reviewed, RF sent RTC 1 year

## 2022-05-23 NOTE — Patient Instructions (Addendum)
Vaccines I recommend:  Covid booster Flu shot every fall  Watch your diet closely     GO TO THE LAB : Get the blood work     [place], [place] Come back for physical exam in 1 year

## 2022-05-24 ENCOUNTER — Encounter: Payer: Self-pay | Admitting: Internal Medicine

## 2022-05-24 NOTE — Assessment & Plan Note (Signed)
Here for CPX Hyperlipidemia: Has a family history of high cholesterol including  his father and twin brother. His twin brother had increased LFTs when he was Rx statins. On reviewing his cholesterol, in 2022 it was very good in the context of following a very healthy diet for 3 to 4 weeks. Plan: NMR, further advised for results, he will be reluctant to take his statins, if cholesterol is quite elevated we could recheck in 6 months or refer to the cholesterol clinic. Anxiety, ADHD: Takes medications rarely, PDMP reviewed, RF sent RTC 1 year

## 2022-05-24 NOTE — Assessment & Plan Note (Signed)
-  T dap 2018 -Covid vax: booster rec - FH prostate cancer: Asymptomatic, no DRE done, check PSA for baseline -Flu shot: rec shots every year -Labs: CBC CMP NMR PSA -Lifestyle: Admits he has not been eating healthy or exercising, we had a long discussion about healthy lifestyle.

## 2022-05-25 ENCOUNTER — Encounter: Payer: Self-pay | Admitting: Internal Medicine

## 2022-05-25 LAB — DRUG MONITORING PANEL 375977 , URINE
Alcohol Metabolites: POSITIVE ng/mL — AB (ref <500)
Amphetamines: NEGATIVE ng/mL (ref <500)
Barbiturates: NEGATIVE ng/mL (ref <300)
Benzodiazepines: NEGATIVE ng/mL (ref <100)
Cocaine Metabolite: NEGATIVE ng/mL (ref <150)
Desmethyltramadol: NEGATIVE ng/mL (ref <100)
Ethyl Glucuronide (ETG): 2744 ng/mL — ABNORMAL HIGH (ref <500)
Ethyl Sulfate (ETS): 613 ng/mL — ABNORMAL HIGH (ref <100)
Marijuana Metabolite: NEGATIVE ng/mL (ref <20)
Opiates: NEGATIVE ng/mL (ref <100)
Oxycodone: NEGATIVE ng/mL (ref <100)
Tramadol: NEGATIVE ng/mL (ref <100)

## 2022-05-25 LAB — DM TEMPLATE

## 2022-05-27 LAB — NMR, LIPOPROFILE

## 2022-05-27 LAB — SPECIMEN STATUS REPORT

## 2022-06-01 ENCOUNTER — Encounter: Payer: Self-pay | Admitting: Internal Medicine

## 2022-06-01 DIAGNOSIS — E785 Hyperlipidemia, unspecified: Secondary | ICD-10-CM

## 2022-06-01 LAB — NMR LIPOPROFILE WITH LIPIDS
Cholesterol, Total: 246
HDL P: 32.4
HDL-C: 52
LDL P: 2090
LDL Size: 21.2
LDL-C (NIH Calc): 170
LP-IR Score: 42
Small LDL-P: 953
Triglycerides: 135 (ref 40–160)

## 2022-06-05 MED ORDER — ROSUVASTATIN CALCIUM 20 MG PO TABS: 20.0000 mg | ORAL_TABLET | Freq: Every day | ORAL | 0 refills | Status: DC

## 2022-06-05 NOTE — Telephone Encounter (Signed)
Rosuvastatin 20 mg 1 tablet nightly, 74-month supply. Labs in 6 weeks: NMR AST ALT

## 2022-06-05 NOTE — Addendum Note (Signed)
Addended byDamita Dunnings D on: 06/05/2022 04:45 PM   Modules accepted: Orders

## 2022-06-05 NOTE — Telephone Encounter (Signed)
Rx sent. Orders placed.

## 2022-07-18 ENCOUNTER — Other Ambulatory Visit: Payer: BC Managed Care – PPO

## 2022-09-01 ENCOUNTER — Other Ambulatory Visit: Payer: Self-pay | Admitting: Internal Medicine

## 2022-09-10 ENCOUNTER — Encounter: Payer: Self-pay | Admitting: Internal Medicine

## 2023-05-25 ENCOUNTER — Encounter: Payer: Self-pay | Admitting: Internal Medicine

## 2023-05-25 ENCOUNTER — Ambulatory Visit: Payer: BC Managed Care – PPO | Admitting: Internal Medicine

## 2023-05-25 VITALS — BP 114/82 | HR 60 | Temp 98.0°F | Resp 16 | Ht 72.0 in | Wt 218.5 lb

## 2023-05-25 DIAGNOSIS — Z79899 Other long term (current) drug therapy: Secondary | ICD-10-CM

## 2023-05-25 DIAGNOSIS — Z0000 Encounter for general adult medical examination without abnormal findings: Secondary | ICD-10-CM

## 2023-05-25 DIAGNOSIS — E785 Hyperlipidemia, unspecified: Secondary | ICD-10-CM

## 2023-05-25 DIAGNOSIS — F988 Other specified behavioral and emotional disorders with onset usually occurring in childhood and adolescence: Secondary | ICD-10-CM

## 2023-05-25 LAB — CBC WITH DIFFERENTIAL/PLATELET
Basophils Absolute: 0 10*3/uL (ref 0.0–0.1)
Basophils Relative: 0.7 % (ref 0.0–3.0)
Eosinophils Absolute: 0.1 10*3/uL (ref 0.0–0.7)
Eosinophils Relative: 1.8 % (ref 0.0–5.0)
HCT: 45 % (ref 39.0–52.0)
Hemoglobin: 15.2 g/dL (ref 13.0–17.0)
Lymphocytes Relative: 26.4 % (ref 12.0–46.0)
Lymphs Abs: 1.1 10*3/uL (ref 0.7–4.0)
MCHC: 33.7 g/dL (ref 30.0–36.0)
MCV: 90.3 fl (ref 78.0–100.0)
Monocytes Absolute: 0.4 10*3/uL (ref 0.1–1.0)
Monocytes Relative: 10.2 % (ref 3.0–12.0)
Neutro Abs: 2.4 10*3/uL (ref 1.4–7.7)
Neutrophils Relative %: 60.9 % (ref 43.0–77.0)
Platelets: 263 10*3/uL (ref 150.0–400.0)
RBC: 4.98 Mil/uL (ref 4.22–5.81)
RDW: 13.5 % (ref 11.5–15.5)
WBC: 4 10*3/uL (ref 4.0–10.5)

## 2023-05-25 LAB — COMPREHENSIVE METABOLIC PANEL
ALT: 34 U/L (ref 0–53)
AST: 20 U/L (ref 0–37)
Albumin: 4.9 g/dL (ref 3.5–5.2)
Alkaline Phosphatase: 86 U/L (ref 39–117)
BUN: 17 mg/dL (ref 6–23)
CO2: 30 meq/L (ref 19–32)
Calcium: 9.5 mg/dL (ref 8.4–10.5)
Chloride: 103 meq/L (ref 96–112)
Creatinine, Ser: 1.03 mg/dL (ref 0.40–1.50)
GFR: 91.37 mL/min (ref >60.00)
Glucose, Bld: 97 mg/dL (ref 70–99)
Potassium: 4.4 meq/L (ref 3.5–5.1)
Sodium: 139 meq/L (ref 135–145)
Total Bilirubin: 0.5 mg/dL (ref 0.2–1.2)
Total Protein: 7.3 g/dL (ref 6.0–8.3)

## 2023-05-25 LAB — LIPID PANEL
Cholesterol: 210 mg/dL — ABNORMAL HIGH (ref 0–200)
HDL: 52.1 mg/dL (ref >39.00)
LDL Cholesterol: 145 mg/dL — ABNORMAL HIGH (ref 0–99)
NonHDL: 157.58
Total CHOL/HDL Ratio: 4
Triglycerides: 64 mg/dL (ref 0.0–149.0)
VLDL: 12.8 mg/dL (ref 0.0–40.0)

## 2023-05-25 MED ORDER — AMPHETAMINE-DEXTROAMPHETAMINE 20 MG PO TABS: 20.0000 mg | ORAL_TABLET | Freq: Every day | ORAL | 0 refills | Status: DC

## 2023-05-25 NOTE — Patient Instructions (Addendum)
 Vaccines I recommend: Covid booster Flu shot every fall  Continue exercising  Try to improve your diet, talking to a  nutritionist would be ideal    GO TO THE LAB : Get the blood work     Please go to the front desk: Schedule a physical exam in 1 year.  Call sooner if needed    HEALTHY SLEEP Sleep hygiene: Basic rules for a good night's sleep  Sleep only as much as you need to feel rested and then get out of bed  Keep a regular sleep schedule  Avoid forcing sleep  Exercise regularly for at least 20 minutes, preferably 4 to 5 hours before bedtime  Avoid caffeinated beverages after lunch  Avoid alcohol near bedtime: no "night cap"  Avoid smoking, especially in the evening  Do not go to bed hungry  Adjust bedroom environment  Avoid prolonged use of light-emitting screens before bedtime   Deal with your worries before bedtime

## 2023-05-25 NOTE — Progress Notes (Signed)
 Subjective:    Patient ID: Victor Martinez, male    DOB: 10-10-1985, 38 y.o.   MRN: [id number]  DOS:  05/25/2023 Type of visit - description: CPX  Here for CPX.  Other than some stress and difficulty sleeping from work-related issues he feels well.   Review of Systems  Other than above, a 14 point review of systems is negative      Past Medical History:  Diagnosis Date   ADHD (attention deficit hyperactivity disorder)    Mild   Allergies    Anxiety    Hyperlipidemia    Insomnia     Past Surgical History:  Procedure Laterality Date   NO PAST SURGERIES     Social History   Socioeconomic History   Marital status: Married    Spouse name: Not on file   Number of children: 2   Years of education: Not on file   Highest education level: Not on file  Occupational History   OccupationDietitian, has his own business  Tobacco Use   Smoking status: Never   Smokeless tobacco: Never  Vaping Use   Vaping status: Never Used  Substance and Sexual Activity   Alcohol use: Yes    Alcohol/week: 3.0 standard drinks of alcohol    Types: 3 Standard drinks or equivalent per week    Comment: socially   Drug use: No   Sexual activity: Yes  Other Topics Concern   Not on file  Social History Narrative   2 boys    Social Drivers of Corporate investment banker Strain: Not on file  Food Insecurity: No Food Insecurity (02/22/2021)   Received from [facility], [facility]   Hunger Vital Sign    Worried About Running Out of Food in the Last Year: Never true    Ran Out of Food in the Last Year: Never true  Transportation Needs: Not on file  Physical Activity: Not on file  Stress: Not on file  Social Connections: Unknown (07/25/2021)   Received from [facility], [facility]   Social Network    Social Network: Not on file  Intimate Partner Violence: Unknown (06/16/2021)   Received from [facility], [facility]   HITS    Physically Hurt: Not on file    Insult or  Talk Down To: Not on file    Threaten Physical Harm: Not on file    Scream or Curse: Not on file    Current Outpatient Medications  Medication Instructions   amphetamine-dextroamphetamine (ADDERALL) 20 MG tablet 20 mg, Oral, Daily       Objective:   Physical Exam BP 114/82   Pulse 60   Temp 98 F (36.7 C) (Oral)   Resp 16   Ht 6' (1.829 m)   Wt 218 lb 8 oz (99.1 kg)   SpO2 97%   BMI 29.63 kg/m  General: Well developed, NAD, BMI noted Neck: No  thyromegaly  HEENT:  Normocephalic . Face symmetric, atraumatic Lungs:  CTA B Normal respiratory effort, no intercostal retractions, no accessory muscle use. Heart: RRR,  no murmur.  Abdomen:  Not distended, soft, non-tender. No rebound or rigidity.   Lower extremities: no pretibial edema bilaterally  Skin: Exposed areas without rash. Not pale. Not jaundice Neurologic:  alert & oriented X3.  Speech normal, gait appropriate for age and unassisted Strength symmetric and appropriate for age.  Psych: Cognition and judgment appear intact.  Cooperative with normal attention span and concentration.  Behavior appropriate. No  anxious or depressed appearing.     Assessment     Assessment (new patient 05-2021 ) Hyperlipidemia Anxiety ADHD  Plan: Here for CPX Tdap 2018 Rec flu and covid vax ,pros>cons - FH prostate cancer: asx, PSA (for baseline) 2024: 1.23. -Labs: CBC CMP FLP -Lifestyle: See comments under dyslipidemia Other issues Dyslipidemia: We had a long discussion about the issue, last year was prescribed statins, he never tried. Exercises regularly but there is lot of room for improvement on his diet. We talked about the need for portion control, decrease carbohydrates, increase fruits and vegetables, decrease meat portion  etc. Seen a nutritionist would be ideal for him. At the end he desires to continue with lifestyle modification, is not considering statins at this point. Recheck FLP. Anxiety, ADHD,  insomnia: Takes Adderall very seldom, 2-3 times a month.  Does not like Xanax for insomnia, it was deleted from his list. Tips for insomnia management provided.  Melatonin is a  option. RTC 1 year

## 2023-05-26 ENCOUNTER — Encounter: Payer: Self-pay | Admitting: Internal Medicine

## 2023-05-26 NOTE — Assessment & Plan Note (Signed)
 Here for CPX  Other issues Dyslipidemia: We had a long discussion about the issue, last year was prescribed statins, he never tried. Exercises regularly but there is lot of room for improvement on his diet. We talked about the need for portion control, decrease carbohydrates, increase fruits and vegetables, decrease meat portion  etc. Seen a nutritionist would be ideal for him. At the end he desires to continue with lifestyle modification, is not considering statins at this point. Recheck FLP. Anxiety, ADHD, insomnia: Takes Adderall very seldom, 2-3 times a month.  Does not like Xanax for insomnia, it was deleted from his list. Tips for insomnia management provided.  Melatonin is a  option. RTC 1 year

## 2023-05-26 NOTE — Assessment & Plan Note (Signed)
 Here for CPX Tdap 2018 Rec flu and covid vax ,pros>cons - FH prostate cancer: asx, PSA (for baseline) 2024: 1.23. -Labs: CBC CMP FLP -Lifestyle: See comments under dyslipidemia

## 2023-05-28 ENCOUNTER — Encounter: Payer: Self-pay | Admitting: Internal Medicine

## 2023-05-28 MED ORDER — AMPHETAMINE-DEXTROAMPHETAMINE 20 MG PO TABS: 20.0000 mg | ORAL_TABLET | Freq: Every day | ORAL | 0 refills | Status: AC

## 2023-05-29 ENCOUNTER — Encounter: Payer: BC Managed Care – PPO | Admitting: Internal Medicine

## 2023-05-30 ENCOUNTER — Telehealth: Payer: Self-pay | Admitting: Pharmacy Technician

## 2023-05-30 ENCOUNTER — Other Ambulatory Visit (HOSPITAL_COMMUNITY): Payer: Self-pay

## 2023-05-30 NOTE — Telephone Encounter (Signed)
 Just submitted, pending 05/30/23

## 2023-05-30 NOTE — Telephone Encounter (Signed)
 Pharmacy Patient Advocate Encounter   Received notification from Physician's Office that prior authorization for AMPHETAMINE-DEXTRO 20 MG TABLETS is required/requested.   Insurance verification completed.   The patient is insured through [facility] .   Per test claim: [id number]  Submitted and pending

## 2023-06-05 ENCOUNTER — Other Ambulatory Visit (HOSPITAL_COMMUNITY): Payer: Self-pay

## 2023-06-05 ENCOUNTER — Telehealth: Payer: Self-pay

## 2023-06-05 NOTE — Telephone Encounter (Signed)
 Pharmacy Patient Advocate Encounter   Received notification from  Previous request denied, attempting resubmission  that prior authorization for Amphetamine-Dextroamphetamine 20MG  tablets is required/requested.   Insurance verification completed.   The patient is insured through [facility] ADVANTAGE/RX ADVANCE .   Per test claim: PA required and submitted KEY/EOC/Request #: [id number] APPROVED from 06/05/2023 to 06/04/2026

## 2023-07-09 ENCOUNTER — Encounter: Payer: Self-pay | Admitting: Internal Medicine

## 2023-07-09 DIAGNOSIS — M79673 Pain in unspecified foot: Secondary | ICD-10-CM

## 2023-07-12 ENCOUNTER — Other Ambulatory Visit: Payer: Self-pay

## 2023-07-12 ENCOUNTER — Ambulatory Visit

## 2023-07-12 ENCOUNTER — Ambulatory Visit (INDEPENDENT_AMBULATORY_CARE_PROVIDER_SITE_OTHER): Admitting: Podiatry

## 2023-07-12 DIAGNOSIS — M2011 Hallux valgus (acquired), right foot: Secondary | ICD-10-CM | POA: Diagnosis not present

## 2023-07-12 DIAGNOSIS — M19072 Primary osteoarthritis, left ankle and foot: Secondary | ICD-10-CM | POA: Diagnosis not present

## 2023-07-12 DIAGNOSIS — M79672 Pain in left foot: Secondary | ICD-10-CM | POA: Diagnosis not present

## 2023-07-12 DIAGNOSIS — M2012 Hallux valgus (acquired), left foot: Secondary | ICD-10-CM | POA: Diagnosis not present

## 2023-07-12 DIAGNOSIS — M7751 Other enthesopathy of right foot: Secondary | ICD-10-CM

## 2023-07-12 DIAGNOSIS — M722 Plantar fascial fibromatosis: Secondary | ICD-10-CM | POA: Diagnosis not present

## 2023-07-12 DIAGNOSIS — M7752 Other enthesopathy of left foot: Secondary | ICD-10-CM | POA: Diagnosis not present

## 2023-07-12 DIAGNOSIS — M79671 Pain in right foot: Secondary | ICD-10-CM | POA: Diagnosis not present

## 2023-07-12 DIAGNOSIS — M19071 Primary osteoarthritis, right ankle and foot: Secondary | ICD-10-CM | POA: Diagnosis not present

## 2023-07-12 MED ORDER — DEXAMETHASONE SODIUM PHOSPHATE 120 MG/30ML IJ SOLN
4.0000 mg | Freq: Once | INTRAMUSCULAR | Status: AC
  Administered 2023-07-12: 4 mg via INTRA_ARTICULAR

## 2023-07-12 MED ORDER — TRIAMCINOLONE ACETONIDE 10 MG/ML IJ SUSP
2.5000 mg | Freq: Once | INTRAMUSCULAR | Status: AC
  Administered 2023-07-12: 2.5 mg via INTRA_ARTICULAR

## 2023-07-12 NOTE — Progress Notes (Signed)
  Subjective:  Patient ID: Victor Martinez, male    DOB: June 05, 1985,   MRN: [id number]  Chief Complaint  Patient presents with   Plantar Fasciitis    Pt presents for bil heel pain that started about a year ago and it has gotten worse pt stated that when it started it was only in the left foot but now it's both feet.     38 y.o. male presents for concern of bilateral foot pain. Relates left more than right. States this pain has been ongoing for about a year.  Relates gets a lot of pain with first steps in the morning and has been progressively worsening. Has tried some stretching and not really getting any better. Does not like to take medication.    . Denies any other pedal complaints. Denies n/v/f/c.   Past Medical History:  Diagnosis Date   ADHD (attention deficit hyperactivity disorder)    Mild   Allergies    Anxiety    Hyperlipidemia    Insomnia     Objective:  Physical Exam: Vascular: DP/PT pulses 2/4 bilateral. CFT <3 seconds. Normal hair growth on digits. No edema.  Skin. No lacerations or abrasions bilateral feet.  Musculoskeletal: MMT 5/5 bilateral lower extremities in DF, PF, Inversion and Eversion. Deceased ROM in DF of ankle joint. Tender to the medial calcaneal tubercle left pain more proximally along healing and some pain in distal insertion of achilles . No pain with achilles proximally, PT or arch. No pain with calcaneal squeeze.   Neurological: Sensation intact to light touch.   Assessment:   1. Plantar fasciitis of left foot      Plan:  Patient was evaluated and treated and all questions answered. Discussed plantar fasciitis with patient.  X-rays reviewed and discussed with patient. No acute fractures or dislocations noted. Minimal spurring noted at inferior calcaneus.  Discussed treatment options including, ice, NSAIDS, supportive shoes, bracing, and stretching. Stretching exercises provided to be done on a daily basis.   PF brace dipsnesed.  Patient  requesting injection today. Procedure note below.   Follow-up 6 weeks or sooner if any problems arise. In the meantime, encouraged to call the office with any questions, concerns, change in symptoms.   Procedure:  Discussed etiology, pathology, conservative vs. surgical therapies. At this time a plantar fascial injection was recommended.  The patient agreed and a sterile skin prep was applied.  An injection consisting of  1cc dexamethasone  0.5 cc kenalog  and 1cc marcaine mixture was infiltrated at the point of maximal tenderness on the left Heel.  Bandaid applied. The patient tolerated this well and was given instructions for aftercare.    Jennefer Moats, DPM

## 2023-07-12 NOTE — Patient Instructions (Signed)

## 2023-08-02 ENCOUNTER — Encounter: Payer: Self-pay | Admitting: Podiatry

## 2023-08-02 ENCOUNTER — Other Ambulatory Visit: Payer: Self-pay | Admitting: Podiatry

## 2023-08-02 MED ORDER — MELOXICAM 15 MG PO TABS: 15.0000 mg | ORAL_TABLET | Freq: Every day | ORAL | 0 refills | Status: DC | PRN

## 2023-08-29 ENCOUNTER — Other Ambulatory Visit: Payer: Self-pay | Admitting: Podiatry

## 2023-08-30 ENCOUNTER — Encounter: Payer: Self-pay | Admitting: Podiatry

## 2023-08-30 ENCOUNTER — Ambulatory Visit (INDEPENDENT_AMBULATORY_CARE_PROVIDER_SITE_OTHER): Admitting: Podiatry

## 2023-08-30 DIAGNOSIS — M722 Plantar fascial fibromatosis: Secondary | ICD-10-CM

## 2023-08-30 NOTE — Progress Notes (Signed)
  Subjective:  Patient ID: Victor Martinez, male    DOB: [birth year],   MRN: [id number]  Chief Complaint  Patient presents with   Foot Pain    Follow up PF left   The shot helped for about 2 days and then it was back like it was. I did call and get an anti-inflammatory and I try to take that as needed    38 y.o. male presents for follow-up of left plantar fasciitis. Relates injeciton helped for two days. He has been stretching and wearing brace but still getting sore after first steps in morning especially with previous activity. .    . Denies any other pedal complaints. Denies n/v/f/c.   Past Medical History:  Diagnosis Date   ADHD (attention deficit hyperactivity disorder)    Mild   Allergies    Anxiety    Hyperlipidemia    Insomnia     Objective:  Physical Exam: Vascular: DP/PT pulses 2/4 bilateral. CFT <3 seconds. Normal hair growth on digits. No edema.  Skin. No lacerations or abrasions bilateral feet.  Musculoskeletal: MMT 5/5 bilateral lower extremities in DF, PF, Inversion and Eversion. Deceased ROM in DF of ankle joint. Tender to the medial calcaneal tubercle left pain more proximally along healing and some pain in distal insertion of achilles . No pain with achilles proximally, PT or arch. No pain with calcaneal squeeze.  Neurological: Sensation intact to light touch.   Assessment:   1. Plantar fasciitis of left foot      Plan:  Patient was evaluated and treated and all questions answered. Discussed plantar fasciitis with patient.  X-rays reviewed and discussed with patient. No acute fractures or dislocations noted. Minimal spurring noted at inferior calcaneus.  Discussed treatment options including, ice, NSAIDS, supportive shoes, bracing, and stretching. Continue stretching and brace  Amb ref to PT  Patient defers injection today.  Follow-up 9 weeks or sooner if any problems arise. In the meantime, encouraged to call the office with any questions, concerns, change  in symptoms.      Jennefer Moats, DPM

## 2023-09-06 ENCOUNTER — Encounter: Payer: Self-pay | Admitting: Podiatry

## 2023-09-06 MED ORDER — DICLOFENAC SODIUM 75 MG PO TBEC: 75.0000 mg | DELAYED_RELEASE_TABLET | Freq: Two times a day (BID) | ORAL | 0 refills | Status: DC

## 2023-10-01 NOTE — Therapy (Signed)
 OUTPATIENT PHYSICAL THERAPY LOWER EXTREMITY EVALUATION   Patient Name: Victor Martinez MRN: [id number] DOB:01-09-1986, 38 y.o., male Today's Date: 10/02/2023  END OF SESSION:  PT End of Session - 10/02/23 0804     Visit Number 1    Date for PT Re-Evaluation 11/27/23    Authorization Type BCBS    PT Start Time 0804    PT Stop Time 0843    PT Time Calculation (min) 39 min    Activity Tolerance Patient tolerated treatment well    Behavior During Therapy [facility] for tasks assessed/performed          Past Medical History:  Diagnosis Date   ADHD (attention deficit hyperactivity disorder)    Mild   Allergies    Anxiety    Hyperlipidemia    Insomnia    Past Surgical History:  Procedure Laterality Date   NO PAST SURGERIES     Patient Active Problem List   Diagnosis Date Noted   PCP NOTES >>>>>>>>>>>>>>>>> 05/21/2021   Hyperlipidemia 05/20/2021   Other insomnia 02/22/2021   Annual physical exam 10/20/2014   Family hx of prostate cancer 10/20/2014   BMI 27.0-27.9,adult 10/20/2014   Attention deficit disorder 02/06/2007    PCP: Amon Aloysius BRAVO, MD   REFERRING PROVIDER: Joya Stabs, DPM   REFERRING DIAG: M72.2 (ICD-10-CM) - Plantar fasciitis of left foot   THERAPY DIAG:  Pain in left ankle and joints of left foot  Pain in right ankle and joints of right foot  Stiffness of left ankle, not elsewhere classified  Stiffness of right ankle, not elsewhere classified  Cramp and spasm  Acute pain of right knee  Rationale for Evaluation and Treatment: Rehabilitation  ONSET DATE: January 2025  SUBJECTIVE:   SUBJECTIVE STATEMENT: Always had tight achilles and calves. Used to run a lot. Left foot started to hurt and just has never gotten better. They both hurt in the morning. Doesn't feel it when he runs. Now his right knee is hurting and is swollen. Normally runs 2-3 x/wk for 3-5 miles. Has stopped due to R knee pain. Had injection in left heel helped for 2 days. Only  takes anti-inflammatories if he is going to run. Wears insoles which have helped. Patient reports he changed shoes to [employer] back in January. He has returned to previous shoe for running [employer]).   PERTINENT HISTORY: unremarkable PAIN:  Are you having pain? Yes: NPRS scale: 2/10 at rest up to 8/10 Pain location: bottom of heel  Pain description: achy dull B to sharp if steps on it just so Aggravating factors: mornings Relieving factors: lie down  PRECAUTIONS: None  RED FLAGS: None   WEIGHT BEARING RESTRICTIONS: No  FALLS:  Has patient fallen in last 6 months? No  LIVING ENVIRONMENT: Lives with: lives with their family Lives in: House/apartment Stairs:    OCCUPATION: sits for work, minimal walking doesn't bother him much  PLOF: Independent and Leisure: playing soccer with kids; running  PATIENT GOALS: get the pain away  NEXT MD VISIT: one month  OBJECTIVE:  Note: Objective measures were completed at Evaluation unless otherwise noted.  DIAGNOSTIC FINDINGS:  XR LEFT FOOT IMPRESSION: 1. Mild to moderate hallux valgus. 2. Mild tibiotalar and talonavicular osteoarthritis. 3. Small plantar calcaneal heel spur.  XR RIGHT IMPRESSION: 1. Mild hallux valgus. 2. Minimal great toe metatarsophalangeal and talonavicular osteoarthritis. 3. Mild anterior tibiotalar osteoarthritis. 4. No plantar calcaneal heel spur  PATIENT SURVEYS:  PSFS: THE PATIENT SPECIFIC FUNCTIONAL SCALE  Place score of  0-10 (0 = unable to perform activity and 10 = able to perform activity at the same level as before injury or problem)  Activity Date: 10/02/23    Walking barefoot 4    2.  Running 0    3. Getting out of bed and walking 4    4. Playing with kids  4    Total Score 3      Total Score = Sum of activity scores/number of activities  Minimally Detectable Change: 3 points (for single activity); 2 points (for average score)  [employer] [facility] (nd). The Patient Specific Functional  Scale . Retrieved from [email or web address]   COGNITION: Overall cognitive status: Within functional limits for tasks assessed     EDEMA:  Visible edema in R knee compared to L  MUSCLE LENGTH: Marked gastoc/soleus tightness R>L  POSTURE: B hallux valgus. Mild pronation L >R   PALPATION: TTP at base of heels and medial plantar surface near heel on L  LOWER EXTREMITY ROM:  A/P ROM Right eval Left eval  Ankle dorsiflexion -12/-5 0/10  Ankle plantarflexion 70 49/68  Ankle inversion    Ankle eversion     (Blank rows = not tested)  LOWER EXTREMITY MMT: 5/5 B ankles, B knee flex 4+5    GAIT: Ambulates on lateral side of foot on L                                                                                                                                TREATMENT DATE:   10/02/23  See pt ed and HEP   PATIENT EDUCATION:  Education details: PT eval findings, anticipated POC, initial HEP, role of TPDN, fee schedule for TPDN and lack of insurance coverage requiring payment at time of service, and discussed iontophoresis option, but difficult for placement also discussed orthotics and shoe wear.    Person educated: Patient Education method: Explanation, Demonstration, Tactile cues, Verbal cues, and Handouts Education comprehension: verbalized understanding and returned demonstration  HOME EXERCISE PROGRAM: Access Code: [email or web address] URL: [email or web address] Date: 10/02/2023 Prepared by: Mliss  Exercises - Gastroc Stretch on Wall  - 2 x daily - 7 x weekly - 1 sets - 3 reps - 30-60 sec hold - Soleus Stretch on Wall  - 2 x daily - 7 x weekly - 1 sets - 3 reps - 30-60 sec hold - Standing Plantar Fascia Mobilization with Small Ball  - 1 x daily - 3 x weekly - 2 sets - 10 reps - Arch Stretch  - 2 x daily - 7 x weekly - 1 sets - 3 reps - 30 sec hold  ASSESSMENT:  CLINICAL IMPRESSION: Patient is a 38 y.o. male who  was seen today for physical therapy evaluation and treatment for B plantar faciitis L> R. He also reports acute R knee pain which he feels is due to compensation from his L ankle.  He has marked tightness in B gastoc/soleus muscles  affecting his ROM and ability to descend stairs. He has B hallux valgus and mild pronation of L foot. He has pain when standing and walking first thing in the morning and can no longer run due to R knee pain and swelling. Pain also limits his ability to play with his children. He will benefit from skilled PT to address these deficits.  .   OBJECTIVE IMPAIRMENTS: decreased activity tolerance, difficulty walking, decreased ROM, decreased strength, increased edema, increased muscle spasms, impaired flexibility, postural dysfunction, and pain.   ACTIVITY LIMITATIONS: standing, stairs, and locomotion level  PARTICIPATION LIMITATIONS: meal prep, cleaning, laundry, community activity, yard work, and playing with kids  PERSONAL FACTORS: Time since onset of injury/illness/exacerbation and 1 comorbidity: R knee pain are also affecting patient's functional outcome.   REHAB POTENTIAL: Excellent  CLINICAL DECISION MAKING: Evolving/moderate complexity  EVALUATION COMPLEXITY: Moderate   GOALS: Goals reviewed with patient? Yes  SHORT TERM GOALS: Target date: 10/30/2023 Patient will be independent with initial HEP. Baseline:  Goal status: INITIAL  2.  Decreased B heel pain by 50% with standing and walking in the morning Baseline:  Goal status: INITIAL  3.  Decreased R knee pain by 50% with ADLS Baseline:  Goal status: INITIAL   LONG TERM GOALS: Target date: 11/27/2023  Patient will be independent with advanced/ongoing HEP to improve outcomes and carryover.  Baseline:  Goal status: INITIAL  2.  Patient will report at least 85% improvement in B ankle/foot pain to improve QOL and allow return to playing with kids and running. Baseline:  Goal status: INITIAL  3.   Patient will demonstrate improved B ankle AROM to [facility] to allow for normal gait and stair mechanics. Baseline:  Goal status: INITIAL  4. Decreased knee pain by 85% with ADLs. Baseline:  Goal status:INITIAL  5.  Patient will be able to ascend/descend stairs with reciprocal step pattern and no UE support Baseline:  Goal status: INITIAL  6.  Improved PSFS by >= 2 points showing functional improvement Baseline: 3 Goal status: INITIAL     PLAN:  PT FREQUENCY: 2x/week  PT DURATION: 8 weeks  PLANNED INTERVENTIONS: 97164- PT Re-evaluation, 97110-Therapeutic exercises, 97530- Therapeutic activity, 97112- Neuromuscular re-education, 97535- Self Care, 02859- Manual therapy, [phone]- Gait training, [phone]- Aquatic Therapy, [phone]- Electrical stimulation (unattended), 97016- Vasopneumatic device, [id number]- Ultrasound, [id number]- Ionotophoresis 4mg /ml Dexamethasone , 79439 (1-2 muscles), 20561 (3+ muscles)- Dry Needling, Patient/Family education, Balance training, Stair training, Taping, Joint mobilization, Cryotherapy, and Moist heat  PLAN FOR NEXT SESSION: DN/MT B gastroc/soleus and quadratus plantae, review progress HEP, add knee exercises if signed POC   Rasheed Welty, PT 10/02/23 4:30 PM

## 2023-10-02 ENCOUNTER — Other Ambulatory Visit: Payer: Self-pay

## 2023-10-02 ENCOUNTER — Encounter: Payer: Self-pay | Admitting: Physical Therapy

## 2023-10-02 ENCOUNTER — Ambulatory Visit: Attending: Podiatry | Admitting: Physical Therapy

## 2023-10-02 DIAGNOSIS — M25672 Stiffness of left ankle, not elsewhere classified: Secondary | ICD-10-CM | POA: Insufficient documentation

## 2023-10-02 DIAGNOSIS — M25572 Pain in left ankle and joints of left foot: Secondary | ICD-10-CM | POA: Diagnosis not present

## 2023-10-02 DIAGNOSIS — M25561 Pain in right knee: Secondary | ICD-10-CM | POA: Insufficient documentation

## 2023-10-02 DIAGNOSIS — M25671 Stiffness of right ankle, not elsewhere classified: Secondary | ICD-10-CM | POA: Insufficient documentation

## 2023-10-02 DIAGNOSIS — M722 Plantar fascial fibromatosis: Secondary | ICD-10-CM | POA: Insufficient documentation

## 2023-10-02 DIAGNOSIS — M25571 Pain in right ankle and joints of right foot: Secondary | ICD-10-CM | POA: Diagnosis not present

## 2023-10-02 DIAGNOSIS — R252 Cramp and spasm: Secondary | ICD-10-CM | POA: Insufficient documentation

## 2023-10-08 NOTE — Therapy (Signed)
 OUTPATIENT PHYSICAL THERAPY LOWER EXTREMITY TREATMENT   Patient Name: Victor Martinez MRN: [id number] DOB:05/24/1985, 38 y.o., male Today's Date: 10/09/2023  END OF SESSION:  PT End of Session - 10/09/23 0800     Visit Number 2    Date for PT Re-Evaluation 11/27/23    Authorization Type BCBS    PT Start Time 0800    PT Stop Time 0845    PT Time Calculation (min) 45 min    Activity Tolerance Patient tolerated treatment well    Behavior During Therapy [facility] for tasks assessed/performed           Past Medical History:  Diagnosis Date   ADHD (attention deficit hyperactivity disorder)    Mild   Allergies    Anxiety    Hyperlipidemia    Insomnia    Past Surgical History:  Procedure Laterality Date   NO PAST SURGERIES     Patient Active Problem List   Diagnosis Date Noted   PCP NOTES >>>>>>>>>>>>>>>>> 05/21/2021   Hyperlipidemia 05/20/2021   Other insomnia 02/22/2021   Annual physical exam 10/20/2014   Family hx of prostate cancer 10/20/2014   BMI 27.0-27.9,adult 10/20/2014   Attention deficit disorder 02/06/2007    PCP: Amon Aloysius BRAVO, MD   REFERRING PROVIDER: Joya Stabs, DPM   REFERRING DIAG: M72.2 (ICD-10-CM) - Plantar fasciitis of left foot   THERAPY DIAG:  Pain in left ankle and joints of left foot  Pain in right ankle and joints of right foot  Stiffness of left ankle, not elsewhere classified  Stiffness of right ankle, not elsewhere classified  Cramp and spasm  Acute pain of right knee  Rationale for Evaluation and Treatment: Rehabilitation  ONSET DATE: January 2025  SUBJECTIVE:   SUBJECTIVE STATEMENT: No change.   Eval: Always had tight achilles and calves. Used to run a lot. Left foot started to hurt and just has never gotten better. They both hurt in the morning. Doesn't feel it when he runs. Now his right knee is hurting and is swollen. Normally runs 2-3 x/wk for 3-5 miles. Has stopped due to R knee pain. Had injection in left heel  helped for 2 days. Only takes anti-inflammatories if he is going to run. Wears insoles which have helped. Patient reports he changed shoes to [employer] back in January. He has returned to previous shoe for running [employer]).   PERTINENT HISTORY: unremarkable PAIN:  Are you having pain? Yes: NPRS scale: 2/10 at rest up to 8/10 Pain location: bottom of heel  Pain description: achy dull B to sharp if steps on it just so Aggravating factors: mornings Relieving factors: lie down  PRECAUTIONS: None  RED FLAGS: None   WEIGHT BEARING RESTRICTIONS: No  FALLS:  Has patient fallen in last 6 months? No  LIVING ENVIRONMENT: Lives with: lives with their family Lives in: House/apartment Stairs:    OCCUPATION: sits for work, minimal walking doesn't bother him much  PLOF: Independent and Leisure: playing soccer with kids; running  PATIENT GOALS: get the pain away  NEXT MD VISIT: one month  OBJECTIVE:  Note: Objective measures were completed at Evaluation unless otherwise noted.  DIAGNOSTIC FINDINGS:  XR LEFT FOOT IMPRESSION: 1. Mild to moderate hallux valgus. 2. Mild tibiotalar and talonavicular osteoarthritis. 3. Small plantar calcaneal heel spur.  XR RIGHT IMPRESSION: 1. Mild hallux valgus. 2. Minimal great toe metatarsophalangeal and talonavicular osteoarthritis. 3. Mild anterior tibiotalar osteoarthritis. 4. No plantar calcaneal heel spur  PATIENT SURVEYS:  PSFS: THE PATIENT SPECIFIC  FUNCTIONAL SCALE  Place score of 0-10 (0 = unable to perform activity and 10 = able to perform activity at the same level as before injury or problem)  Activity Date: 10/02/23    Walking barefoot 4    2.  Running 0    3. Getting out of bed and walking 4    4. Playing with kids  4    Total Score 3      Total Score = Sum of activity scores/number of activities  Minimally Detectable Change: 3 points (for single activity); 2 points (for average score)  [employer] [facility] [facility] (nd). The  Patient Specific Functional Scale . Retrieved from [email or web address]   COGNITION: Overall cognitive status: Within functional limits for tasks assessed     EDEMA:  Visible edema in R knee compared to L  MUSCLE LENGTH: Marked gastoc/soleus tightness R>L  POSTURE: B hallux valgus. Mild pronation L >R   PALPATION: TTP at base of heels and medial plantar surface near heel on L  LOWER EXTREMITY ROM:  A/P ROM Right eval Left eval  Ankle dorsiflexion -12/-5 0/10  Ankle plantarflexion 70 49/68  Ankle inversion    Ankle eversion     (Blank rows = not tested)  LOWER EXTREMITY MMT: 5/5 B ankles, B knee flex 4+5    GAIT: Ambulates on lateral side of foot on L                                                                                                                                TREATMENT DATE:   10/09/23 B Gastroc stretch on incline board 2 x 30 sec  Soleus stretch 2x30 sec B Short foot arch raises in standing x 10 B heel raises 10 sec hold x 10 Unilateral heel raises 3 second pace up/hold/down x 10  Trigger Point Dry Needling  Initial Treatment: Pt instructed on Dry Needling rational, procedures, and possible side effects. Pt instructed to expect mild to moderate muscle soreness later in the day and/or into the next day.  Pt instructed in methods to reduce muscle soreness. Pt instructed to continue prescribed HEP. Patient was educated on signs and symptoms of infection and other risk factors and advised to seek medical attention should they occur.  Patient verbalized understanding of these instructions and education.   Patient Verbal Consent Given: Yes Education Handout Provided: Yes Muscles Treated: L quadratus plantae, L gastroc, L soleus Electrical Stimulation Performed: No Treatment Response/Outcome: Utilized skilled palpation to identify bony landmarks and trigger points.  Able to illicit twitch response and  muscle elongation.  Soft tissue mobilization and IASTM to muscles needled to further promote tissue elongation and decreased pain.        10/02/23  See pt ed and HEP   PATIENT EDUCATION:  Education details: HEP update, TPDN rational, procedure, outcomes, potential side effects, and recommended post-treatment exercises/activity, and discussed orthotics.    Person educated: Patient Education method: Explanation,  Demonstration, Tactile cues, Verbal cues, and Handouts Education comprehension: verbalized understanding and returned demonstration  HOME EXERCISE PROGRAM: Access Code: [email or web address] URL: [email or web address] Date: 10/02/2023 Prepared by: Mliss  Exercises - Gastroc Stretch on Wall  - 2 x daily - 7 x weekly - 1 sets - 3 reps - 30-60 sec hold - Soleus Stretch on Wall  - 2 x daily - 7 x weekly - 1 sets - 3 reps - 30-60 sec hold - Standing Plantar Fascia Mobilization with Small Ball  - 1 x daily - 3 x weekly - 2 sets - 10 reps - Arch Stretch  - 2 x daily - 7 x weekly - 1 sets - 3 reps - 30 sec hold  ASSESSMENT:  CLINICAL IMPRESSION: Patient demonstrates weakness with L heel raises compared to R with less ROM on L. No pain reported with heel raises. Initial trial of DN with excellent twitch responses in all muscles needled. L medial Soleus referred directly to heel. Patient reports ongoing R knee pain especially with hyperextension of knee. Pain is anterior. Negative Apley compression, but further assessment deferred until next visit.   Eval: Patient is a 38 y.o. male who was seen today for physical therapy evaluation and treatment for B plantar faciitis L> R. He also reports acute R knee pain which he feels is due to compensation from his L ankle.  He has marked tightness in B gastoc/soleus muscles affecting his ROM and ability to descend stairs. He has B hallux valgus and mild pronation of L foot. He has pain when standing and walking first thing in the morning and can no  longer run due to R knee pain and swelling. Pain also limits his ability to play with his children. He will benefit from skilled PT to address these deficits.  .   OBJECTIVE IMPAIRMENTS: decreased activity tolerance, difficulty walking, decreased ROM, decreased strength, increased edema, increased muscle spasms, impaired flexibility, postural dysfunction, and pain.   ACTIVITY LIMITATIONS: standing, stairs, and locomotion level  PARTICIPATION LIMITATIONS: meal prep, cleaning, laundry, community activity, yard work, and playing with kids  PERSONAL FACTORS: Time since onset of injury/illness/exacerbation and 1 comorbidity: R knee pain are also affecting patient's functional outcome.   REHAB POTENTIAL: Excellent  CLINICAL DECISION MAKING: Evolving/moderate complexity  EVALUATION COMPLEXITY: Moderate   GOALS: Goals reviewed with patient? Yes  SHORT TERM GOALS: Target date: 10/30/2023 Patient will be independent with initial HEP. Baseline:  Goal status: INITIAL  2.  Decreased B heel pain by 50% with standing and walking in the morning Baseline:  Goal status: INITIAL  3.  Decreased R knee pain by 50% with ADLS Baseline:  Goal status: INITIAL   LONG TERM GOALS: Target date: 11/27/2023  Patient will be independent with advanced/ongoing HEP to improve outcomes and carryover.  Baseline:  Goal status: INITIAL  2.  Patient will report at least 85% improvement in B ankle/foot pain to improve QOL and allow return to playing with kids and running. Baseline:  Goal status: INITIAL  3.  Patient will demonstrate improved B ankle AROM to [facility] to allow for normal gait and stair mechanics. Baseline:  Goal status: INITIAL  4. Decreased knee pain by 85% with ADLs. Baseline:  Goal status:INITIAL  5.  Patient will be able to ascend/descend stairs with reciprocal step pattern and no UE support Baseline:  Goal status: INITIAL  6.  Improved PSFS by >= 2 points showing functional  improvement Baseline: 3 Goal status: INITIAL     PLAN:  PT FREQUENCY: 2x/week  PT DURATION: 8 weeks  PLANNED INTERVENTIONS: 97164- PT Re-evaluation, 97110-Therapeutic exercises, 97530- Therapeutic activity, 97112- Neuromuscular re-education, 97535- Self Care, 02859- Manual therapy, [phone]- Gait training, [phone]- Aquatic Therapy, [phone]- Electrical stimulation (unattended), 97016- Vasopneumatic device, [id number]- Ultrasound, [id number]- Ionotophoresis 4mg /ml Dexamethasone , 79439 (1-2 muscles), 20561 (3+ muscles)- Dry Needling, Patient/Family education, Balance training, Stair training, Taping, Joint mobilization, Cryotherapy, and Moist heat  PLAN FOR NEXT SESSION: Assess response to DN/MT L gastroc/soleus and quadratus plantae, Assess knee with special tests, progress HEP. Continue manual to gastroc/soleus and plantar fascia. Add hip flexor stretch to HEP.   Jaremy Nosal, PT 10/09/23 9:06 AM

## 2023-10-09 ENCOUNTER — Encounter: Payer: Self-pay | Admitting: Physical Therapy

## 2023-10-09 ENCOUNTER — Ambulatory Visit: Admitting: Physical Therapy

## 2023-10-09 DIAGNOSIS — R252 Cramp and spasm: Secondary | ICD-10-CM | POA: Diagnosis not present

## 2023-10-09 DIAGNOSIS — M722 Plantar fascial fibromatosis: Secondary | ICD-10-CM | POA: Diagnosis not present

## 2023-10-09 DIAGNOSIS — M25672 Stiffness of left ankle, not elsewhere classified: Secondary | ICD-10-CM | POA: Diagnosis not present

## 2023-10-09 DIAGNOSIS — M25572 Pain in left ankle and joints of left foot: Secondary | ICD-10-CM | POA: Diagnosis not present

## 2023-10-09 DIAGNOSIS — M25561 Pain in right knee: Secondary | ICD-10-CM | POA: Diagnosis not present

## 2023-10-09 DIAGNOSIS — M25571 Pain in right ankle and joints of right foot: Secondary | ICD-10-CM

## 2023-10-09 DIAGNOSIS — M25671 Stiffness of right ankle, not elsewhere classified: Secondary | ICD-10-CM

## 2023-10-09 NOTE — Patient Instructions (Signed)

## 2023-10-10 NOTE — Therapy (Signed)
 OUTPATIENT PHYSICAL THERAPY LOWER EXTREMITY TREATMENT   Patient Name: Victor Martinez MRN: [id number] DOB:12/30/1985, 38 y.o., male Today's Date: 10/11/2023  END OF SESSION:  PT End of Session - 10/11/23 0932     Visit Number 3    Date for PT Re-Evaluation 11/27/23    Authorization Type BC[employer]    PT Start Time 0848    PT Stop Time 0932    PT Time Calculation (min) 44 min    Activity Tolerance Patient tolerated treatment well    Behavior During Therapy [facility] for tasks assessed/performed            Past Medical History:  Diagnosis Date   ADHD (attention deficit hyperactivity disorder)    Mild   Allergies    Anxiety    Hyperlipidemia    Insomnia    Past Surgical History:  Procedure Laterality Date   NO PAST SURGERIES     Patient Active Problem List   Diagnosis Date Noted   PCP NOTES >>>>>>>>>>>>>>>>> 05/21/2021   Hyperlipidemia 05/20/2021   Other insomnia 02/22/2021   Annual physical exam 10/20/2014   Family hx of prostate cancer 10/20/2014   BMI 27.0-27.9,adult 10/20/2014   Attention deficit disorder 02/06/2007    PCP: Amon Aloysius BRAVO, MD   REFERRING PROVIDER: Joya Stabs, DPM   REFERRING DIAG: M72.2 (ICD-10-CM) - Plantar fasciitis of left foot   THERAPY DIAG:  Pain in left ankle and joints of left foot  Pain in right ankle and joints of right foot  Stiffness of left ankle, not elsewhere classified  Stiffness of right ankle, not elsewhere classified  Cramp and spasm  Acute pain of right knee  Rationale for Evaluation and Treatment: Rehabilitation  ONSET DATE: January 2025  SUBJECTIVE:   SUBJECTIVE STATEMENT: Just sore in calf. Pain in knee with hyperextension  Eval: Always had tight achilles and calves. Used to run a lot. Left foot started to hurt and just has never gotten better. They both hurt in the morning. Doesn't feel it when he runs. Now his right knee is hurting and is swollen. Normally runs 2-3 x/wk for 3-5 miles. Has stopped due to R  knee pain. Had injection in left heel helped for 2 days. Only takes anti-inflammatories if he is going to run. Wears insoles which have helped. Patient reports he changed shoes to [employer] back in January. He has returned to previous shoe for running [employer]).   PERTINENT HISTORY: unremarkable PAIN:  Are you having pain? Yes: NPRS scale: 4/10 in morning Pain location: bottom of heel  Pain description: achy dull B to sharp if steps on it just so Aggravating factors: mornings Relieving factors: lie down  PRECAUTIONS: None  RED FLAGS: None   WEIGHT BEARING RESTRICTIONS: No  FALLS:  Has patient fallen in last 6 months? No  LIVING ENVIRONMENT: Lives with: lives with their family Lives in: House/apartment Stairs:    OCCUPATION: sits for work, minimal walking doesn't bother him much  PLOF: Independent and Leisure: playing soccer with kids; running  PATIENT GOALS: get the pain away  NEXT MD VISIT: one month  OBJECTIVE:  Note: Objective measures were completed at Evaluation unless otherwise noted.  DIAGNOSTIC FINDINGS:  XR LEFT FOOT IMPRESSION: 1. Mild to moderate hallux valgus. 2. Mild tibiotalar and talonavicular osteoarthritis. 3. Small plantar calcaneal heel spur.  XR RIGHT IMPRESSION: 1. Mild hallux valgus. 2. Minimal great toe metatarsophalangeal and talonavicular osteoarthritis. 3. Mild anterior tibiotalar osteoarthritis. 4. No plantar calcaneal heel spur  PATIENT SURVEYS:  PSFS: THE PATIENT SPECIFIC FUNCTIONAL SCALE  Place score of 0-10 (0 = unable to perform activity and 10 = able to perform activity at the same level as before injury or problem)  Activity Date: 10/02/23    Walking barefoot 4    2.  Running 0    3. Getting out of bed and walking 4    4. Playing with kids  4    Total Score 3      Total Score = Sum of activity scores/number of activities  Minimally Detectable Change: 3 points (for single activity); 2 points (for average score)  [facility] (nd). The Patient Specific Functional Scale . Retrieved from [email or web address]   COGNITION: Overall cognitive status: Within functional limits for tasks assessed     EDEMA:  Visible edema in R knee compared to L  MUSCLE LENGTH: Marked gastoc/soleus tightness R>L  POSTURE: B hallux valgus. Mild pronation L >R   PALPATION: TTP at base of heels and medial plantar surface near heel on L  LOWER EXTREMITY ROM:  R knee 0-136  L 0-134 deg  A/P ROM Right eval Left eval  Ankle dorsiflexion -12/-5 0/10  Ankle plantarflexion 70 49/68  Ankle inversion    Ankle eversion     (Blank rows = not tested)  LOWER EXTREMITY MMT: 5/5 B ankles, B knee flex 4+5    GAIT: Ambulates on lateral side of foot on L  10/11/23 Knee assessed: full ROM, R HS 4+/5 in prone, TTP at medial patellar into MJL and medial patellar tendon Negative ant/post drawer, Apley compression and McMurray tests. 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 Crepitis with squats and pain with full extension. Noticeable edema compared to L knee.                                                                                                                               TREATMENT DATE:   10/11/23 Assess R knee: see above B heel raises 10 sec hold x 10 B heel raises with ball b/w heels x 20 Unilateral heel raises 3 second pace up/hold/down x 10 Squat with heel raise x 10 SLS Stair taps 8 and steps one and two x 10 ea SLS 3 way reaches x 5 ea Soleus/gastroc stretch at stairs     10/09/23 B Gastroc stretch on incline board 2 x 30 sec  Soleus stretch 2x30 sec B Short foot arch raises in standing x 10 B heel raises 10 sec hold x 10 Unilateral heel raises 3 second pace up/hold/down x 10  Trigger Point Dry Needling  Initial Treatment: Pt instructed on Dry Needling rational, procedures, and possible side effects. Pt instructed to expect mild to moderate  muscle soreness later in the day and/or into the next day.  Pt instructed in methods to reduce muscle soreness. Pt instructed to continue prescribed HEP. Patient was educated on signs and symptoms of infection and other risk factors and  advised to seek medical attention should they occur.  Patient verbalized understanding of these instructions and education.   Patient Verbal Consent Given: Yes Education Handout Provided: Yes Muscles Treated: L quadratus plantae, L gastroc, L soleus Electrical Stimulation Performed: No Treatment Response/Outcome: Utilized skilled palpation to identify bony landmarks and trigger points.  Able to illicit twitch response and muscle elongation.  Soft tissue mobilization and IASTM to muscles needled to further promote tissue elongation and decreased pain.        10/02/23  See pt ed and HEP   PATIENT EDUCATION:  Education details: HEP update, TPDN rational, procedure, outcomes, potential side effects, and recommended post-treatment exercises/activity, and discussed orthotics.    Person educated: Patient Education method: Explanation, Demonstration, Tactile cues, Verbal cues, and Handouts Education comprehension: verbalized understanding and returned demonstration  HOME EXERCISE PROGRAM: Access Code: [email or web address] URL: [email or web address] Date: 10/02/2023 Prepared by: Mliss  Exercises - Gastroc Stretch on Wall  - 2 x daily - 7 x weekly - 1 sets - 3 reps - 30-60 sec hold - Soleus Stretch on Wall  - 2 x daily - 7 x weekly - 1 sets - 3 reps - 30-60 sec hold - Standing Plantar Fascia Mobilization with Small Ball  - 1 x daily - 3 x weekly - 2 sets - 10 reps - Arch Stretch  - 2 x daily - 7 x weekly - 1 sets - 3 reps - 30 sec hold  ASSESSMENT:  CLINICAL IMPRESSION: Patient reports ongoing R knee pain especially with hyperextension of knee. R Knee assessment reveals: full ROM, R HS strength 4+/5 in prone with mild discomfort, TTP at medial patellar  into MJL and medial patellar tendon. Negative ant/post drawer, Apley compression and McMurray tests. 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 Crepitis with squats and pain with full extension. Noticeable edema compared to L knee. PT advised patient see MD secondary to 6 week duration. Advised he hold off running or any exercise that cause pain. He reported improved flexibility in L soleus after DN. Foot pain remains the same in the morning. Good tolerance to TE today. Balance is challenged B in SLS.   Eval: Patient is a 38 y.o. male who was seen today for physical therapy evaluation and treatment for B plantar faciitis L> R. He also reports acute R knee pain which he feels is due to compensation from his L ankle.  He has marked tightness in B gastoc/soleus muscles affecting his ROM and ability to descend stairs. He has B hallux valgus and mild pronation of L foot. He has pain when standing and walking first thing in the morning and can no longer run due to R knee pain and swelling. Pain also limits his ability to play with his children. He will benefit from skilled PT to address these deficits.  .   OBJECTIVE IMPAIRMENTS: decreased activity tolerance, difficulty walking, decreased ROM, decreased strength, increased edema, increased muscle spasms, impaired flexibility, postural dysfunction, and pain.   ACTIVITY LIMITATIONS: standing, stairs, and locomotion level  PARTICIPATION LIMITATIONS: meal prep, cleaning, laundry, community activity, yard work, and playing with kids  PERSONAL FACTORS: Time since onset of injury/illness/exacerbation and 1 comorbidity: R knee pain are also affecting patient's functional outcome.   REHAB POTENTIAL: Excellent  CLINICAL DECISION MAKING: Evolving/moderate complexity  EVALUATION COMPLEXITY: Moderate   GOALS: Goals reviewed with patient? Yes  SHORT TERM GOALS: Target date: 10/30/2023 Patient will be independent with initial HEP. Baseline:  Goal status:  INITIAL  2.  Decreased B  heel pain by 50% with standing and walking in the morning Baseline:  Goal status: INITIAL  3.  Decreased R knee pain by 50% with ADLS Baseline:  Goal status: INITIAL   LONG TERM GOALS: Target date: 11/27/2023  Patient will be independent with advanced/ongoing HEP to improve outcomes and carryover.  Baseline:  Goal status: INITIAL  2.  Patient will report at least 85% improvement in B ankle/foot pain to improve QOL and allow return to playing with kids and running. Baseline:  Goal status: INITIAL  3.  Patient will demonstrate improved B ankle AROM to [facility] to allow for normal gait and stair mechanics. Baseline:  Goal status: INITIAL  4. Decreased knee pain by 85% with ADLs. Baseline:  Goal status:INITIAL  5.  Patient will be able to ascend/descend stairs with reciprocal step pattern and no UE support Baseline:  Goal status: INITIAL  6.  Improved PSFS by >= 2 points showing functional improvement Baseline: 3 Goal status: INITIAL     PLAN:  PT FREQUENCY: 2x/week  PT DURATION: 8 weeks  PLANNED INTERVENTIONS: 97164- PT Re-evaluation, 97110-Therapeutic exercises, 97530- Therapeutic activity, 97112- Neuromuscular re-education, 97535- Self Care, [id number]- Manual therapy, [id number]- Gait training, [id number]- Aquatic Therapy, [phone]- Electrical stimulation (unattended), 97016- Vasopneumatic device, [id number]- Ultrasound, [id number]- Ionotophoresis 4mg /ml Dexamethasone , 79439 (1-2 muscles), 20561 (3+ muscles)- Dry Needling, Patient/Family education, Balance training, Stair training, Taping, Joint mobilization, Cryotherapy, and Moist heat  PLAN FOR NEXT SESSION: Assess response to DN/MT f/u on knee, review HEP. Continue manual to gastroc/soleus and plantar fascia. Add hip flexor stretch to HEP, ankle and knee strength, work on balance also.   Saron Tweed, PT 10/11/23 1:54 PM

## 2023-10-11 ENCOUNTER — Encounter: Payer: Self-pay | Admitting: Physical Therapy

## 2023-10-11 ENCOUNTER — Ambulatory Visit: Admitting: Physical Therapy

## 2023-10-11 DIAGNOSIS — M25672 Stiffness of left ankle, not elsewhere classified: Secondary | ICD-10-CM | POA: Diagnosis not present

## 2023-10-11 DIAGNOSIS — M25561 Pain in right knee: Secondary | ICD-10-CM

## 2023-10-11 DIAGNOSIS — M25571 Pain in right ankle and joints of right foot: Secondary | ICD-10-CM | POA: Diagnosis not present

## 2023-10-11 DIAGNOSIS — M722 Plantar fascial fibromatosis: Secondary | ICD-10-CM | POA: Diagnosis not present

## 2023-10-11 DIAGNOSIS — M25572 Pain in left ankle and joints of left foot: Secondary | ICD-10-CM

## 2023-10-11 DIAGNOSIS — M25671 Stiffness of right ankle, not elsewhere classified: Secondary | ICD-10-CM | POA: Diagnosis not present

## 2023-10-11 DIAGNOSIS — R252 Cramp and spasm: Secondary | ICD-10-CM | POA: Diagnosis not present

## 2023-10-16 ENCOUNTER — Ambulatory Visit: Payer: Self-pay | Admitting: Sports Medicine

## 2023-10-16 ENCOUNTER — Ambulatory Visit (INDEPENDENT_AMBULATORY_CARE_PROVIDER_SITE_OTHER)

## 2023-10-16 ENCOUNTER — Encounter: Payer: Self-pay | Admitting: Sports Medicine

## 2023-10-16 ENCOUNTER — Ambulatory Visit: Admitting: Physical Therapy

## 2023-10-16 ENCOUNTER — Ambulatory Visit (INDEPENDENT_AMBULATORY_CARE_PROVIDER_SITE_OTHER): Admitting: Sports Medicine

## 2023-10-16 DIAGNOSIS — M25462 Effusion, left knee: Secondary | ICD-10-CM

## 2023-10-16 DIAGNOSIS — S83281A Other tear of lateral meniscus, current injury, right knee, initial encounter: Secondary | ICD-10-CM | POA: Diagnosis not present

## 2023-10-16 DIAGNOSIS — S8991XA Unspecified injury of right lower leg, initial encounter: Secondary | ICD-10-CM

## 2023-10-16 DIAGNOSIS — M2391 Unspecified internal derangement of right knee: Secondary | ICD-10-CM

## 2023-10-16 DIAGNOSIS — M25461 Effusion, right knee: Secondary | ICD-10-CM

## 2023-10-16 DIAGNOSIS — S83206A Unspecified tear of unspecified meniscus, current injury, right knee, initial encounter: Secondary | ICD-10-CM | POA: Insufficient documentation

## 2023-10-16 DIAGNOSIS — M7989 Other specified soft tissue disorders: Secondary | ICD-10-CM | POA: Diagnosis not present

## 2023-10-16 DIAGNOSIS — M2241 Chondromalacia patellae, right knee: Secondary | ICD-10-CM | POA: Diagnosis not present

## 2023-10-16 NOTE — Assessment & Plan Note (Addendum)
 Pleasant 38 year old male, had a twisting injury about 5 weeks ago, he had a pop, followed by swelling, he does have mechanical symptoms, locking, catching. On exam he has an effusion, he has a loose ACL with a positive Lachman sign, he also has pain to patellar compression, potential diagnoses include meniscal tear, ACL tear, patellar chondral injury. Due to mechanical symptoms and surgical planning he will need an MRI ASAP. He will continue meloxicam , follow-up depends on MRI results.  Update:  Update: The ACL is largely intact.  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  If oral analgesics are not helping we can deliver medicine directly into the joint in the form of an injection, and then this will definitely need some physical therapy.  Nothing immediately surgical seen.

## 2023-10-16 NOTE — Progress Notes (Addendum)
    Procedures performed today:    None.  Independent interpretation of notes and tests performed by another provider:   None.  Brief History, Exam, Impression, and Recommendations:    Right knee meniscal tear Pleasant 38 year old male, had a twisting injury about 5 weeks ago, he had a pop, followed by swelling, he does have mechanical symptoms, locking, catching. On exam he has an effusion, he has a loose ACL with a positive Lachman sign, he also has pain to patellar compression, potential diagnoses include meniscal tear, ACL tear, patellar chondral injury. Due to mechanical symptoms and surgical planning he will need an MRI ASAP. He will continue meloxicam , follow-up depends on MRI results.  Update:  Update: The ACL is largely intact.  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  If oral analgesics are not helping we can deliver medicine directly into the joint in the form of an injection, and then this will definitely need some physical therapy.  Nothing immediately surgical seen.   ____________________________________________ Debby PARAS. Curtis, M.D., ABFM., CAQSM., AME. Primary Care and Sports Medicine [facility]  Adjunct Professor of [place]  [employer]  Restaurant manager, fast food

## 2023-10-17 NOTE — Telephone Encounter (Signed)
 I have called patient and advised of recommendations.

## 2023-10-18 ENCOUNTER — Encounter: Admitting: Physical Therapy

## 2023-10-22 NOTE — Therapy (Signed)
 OUTPATIENT PHYSICAL THERAPY LOWER EXTREMITY TREATMENT   Patient Name: Victor Martinez MRN: [id number] DOB:10/25/1985, 38 y.o., male Today's Date: 10/23/2023  END OF SESSION:  PT End of Session - 10/23/23 0807     Visit Number 4    Date for PT Re-Evaluation 11/27/23    Authorization Type [employer]    PT Start Time [phone]   late check in   PT Stop Time 0851    PT Time Calculation (min) 44 min    Activity Tolerance Patient tolerated treatment well             Past Medical History:  Diagnosis Date   ADHD (attention deficit hyperactivity disorder)    Mild   Allergies    Anxiety    Hyperlipidemia    Insomnia    Past Surgical History:  Procedure Laterality Date   NO PAST SURGERIES     Patient Active Problem List   Diagnosis Date Noted   Right knee meniscal tear 10/16/2023   PCP NOTES >>>>>>>>>>>>>>>>> 05/21/2021   Hyperlipidemia 05/20/2021   Other insomnia 02/22/2021   Annual physical exam 10/20/2014   Family hx of prostate cancer 10/20/2014   BMI 27.0-27.9,adult 10/20/2014   Attention deficit disorder 02/06/2007    PCP: Amon Aloysius BRAVO, MD   REFERRING PROVIDER: Joya Stabs, DPM   REFERRING DIAG: M72.2 (ICD-10-CM) - Plantar fasciitis of left foot   THERAPY DIAG:  Pain in left ankle and joints of left foot  Pain in right ankle and joints of right foot  Stiffness of left ankle, not elsewhere classified  Stiffness of right ankle, not elsewhere classified  Acute pain of right knee  Rationale for Evaluation and Treatment: Rehabilitation  ONSET DATE: January 2025  SUBJECTIVE:   SUBJECTIVE STATEMENT:   10/23/2023: says he changed footwear which has helped his pain some. Does his stretches about once a day. Still having morning pain with first few steps. Played golf this weekend and did well. Knee is bothering him more that footat this point - states he got MRI and has a meniscal repair, is planning on discussing further with Dr. ONEIDA. No resting pain.  Eval:  Always had tight achilles and calves. Used to run a lot. Left foot started to hurt and just has never gotten better. They both hurt in the morning. Doesn't feel it when he runs. Now his right knee is hurting and is swollen. Normally runs 2-3 x/wk for 3-5 miles. Has stopped due to R knee pain. Had injection in left heel helped for 2 days. Only takes anti-inflammatories if he is going to run. Wears insoles which have helped. Patient reports he changed shoes to [employer] back in January. He has returned to previous shoe for running [employer]).   PERTINENT HISTORY: unremarkable PAIN:  Are you having pain? Yes: NPRS scale: 4/10 in morning Pain location: bottom of heel  Pain description: achy dull B to sharp if steps on it just so Aggravating factors: mornings Relieving factors: lie down  PRECAUTIONS: None  RED FLAGS: None   WEIGHT BEARING RESTRICTIONS: No  FALLS:  Has patient fallen in last 6 months? No  LIVING ENVIRONMENT: Lives with: lives with their family Lives in: House/apartment Stairs:    OCCUPATION: sits for work, minimal walking doesn't bother him much  PLOF: Independent and Leisure: playing soccer with kids; running  PATIENT GOALS: get the pain away  NEXT MD VISIT: one month  OBJECTIVE:  Note: Objective measures were completed at Evaluation unless otherwise noted.  DIAGNOSTIC FINDINGS:  XR LEFT FOOT IMPRESSION: 1. Mild to moderate hallux valgus. 2. Mild tibiotalar and talonavicular osteoarthritis. 3. Small plantar calcaneal heel spur.  XR RIGHT IMPRESSION: 1. Mild hallux valgus. 2. Minimal great toe metatarsophalangeal and talonavicular osteoarthritis. 3. Mild anterior tibiotalar osteoarthritis. 4. No plantar calcaneal heel spur  PATIENT SURVEYS:  PSFS: THE PATIENT SPECIFIC FUNCTIONAL SCALE  Place score of 0-10 (0 = unable to perform activity and 10 = able to perform activity at the same level as before injury or problem)  Activity Date: 10/02/23    Walking  barefoot 4    2.  Running 0    3. Getting out of bed and walking 4    4. Playing with kids  4    Total Score 3      Total Score = Sum of activity scores/number of activities  Minimally Detectable Change: 3 points (for single activity); 2 points (for average score)  [facility] (nd). The Patient Specific Functional Scale . Retrieved from [email or web address]   COGNITION: Overall cognitive status: Within functional limits for tasks assessed     EDEMA:  Visible edema in R knee compared to L  MUSCLE LENGTH: Marked gastoc/soleus tightness R>L  POSTURE: B hallux valgus. Mild pronation L >R   PALPATION: TTP at base of heels and medial plantar surface near heel on L  LOWER EXTREMITY ROM:  R knee 0-136  L 0-134 deg  A/P ROM Right eval Left eval  Ankle dorsiflexion -12/-5 0/10  Ankle plantarflexion 70 49/68  Ankle inversion    Ankle eversion     (Blank rows = not tested)  LOWER EXTREMITY MMT: 5/5 B ankles, B knee flex 4+5    GAIT: Ambulates on lateral side of foot on L  10/11/23 Knee assessed: full ROM, R HS 4+/5 in prone, TTP at medial patellar into MJL and medial patellar tendon Negative ant/post drawer, Apley compression and McMurray tests. 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 Crepitis with squats and pain with full extension. Noticeable edema compared to L knee.                                                                                                                               TREATMENT DATE:   [facility] Adult PT Treatment:                                                DATE: 10/23/23 Therapeutic Exercise: Soleus stretch at wall 2x30sec Drop step heel raises x20 Wall sit ~45 deg HEP update + discussion/education  Neuromuscular re-ed: Post tib heel raise ball between heels x12 PF mini squat iso hold (<20 deg knee bend) x30sec BIL  Self Care: Education/discussion re: relevant  anatomy/physiology, individualizing exercise in context of arthritis, modifying activities based on symptom behavior, progression w/ PT thus far,  strategies to mitigate risk of reinjury, basic load management principles    10/11/23 Assess R knee: see above B heel raises 10 sec hold x 10 B heel raises with ball b/w heels x 20 Unilateral heel raises 3 second pace up/hold/down x 10 Squat with heel raise x 10 SLS Stair taps 8 and steps one and two x 10 ea SLS 3 way reaches x 5 ea Soleus/gastroc stretch at stairs     10/09/23 B Gastroc stretch on incline board 2 x 30 sec  Soleus stretch 2x30 sec B Short foot arch raises in standing x 10 B heel raises 10 sec hold x 10 Unilateral heel raises 3 second pace up/hold/down x 10  Trigger Point Dry Needling  Initial Treatment: Pt instructed on Dry Needling rational, procedures, and possible side effects. Pt instructed to expect mild to moderate muscle soreness later in the day and/or into the next day.  Pt instructed in methods to reduce muscle soreness. Pt instructed to continue prescribed HEP. Patient was educated on signs and symptoms of infection and other risk factors and advised to seek medical attention should they occur.  Patient verbalized understanding of these instructions and education.   Patient Verbal Consent Given: Yes Education Handout Provided: Yes Muscles Treated: L quadratus plantae, L gastroc, L soleus Electrical Stimulation Performed: No Treatment Response/Outcome: Utilized skilled palpation to identify bony landmarks and trigger points.  Able to illicit twitch response and muscle elongation.  Soft tissue mobilization and IASTM to muscles needled to further promote tissue elongation and decreased pain.        10/02/23  See pt ed and HEP   PATIENT EDUCATION:  Education details: rationale for interventions, HEP  Person educated: Patient Education method: Explanation, Demonstration, Tactile cues, Verbal cues Education  comprehension: verbalized understanding, returned demonstration, verbal cues required, tactile cues required, and needs further education     HOME EXERCISE PROGRAM: Access Code: [email or web address] URL: [email or web address] Date: 10/23/2023 Prepared by: Alm Jenny  Exercises - Gastroc Stretch on Wall  - 2 x daily - 7 x weekly - 1 sets - 3 reps - 30-60 sec hold - Soleus Stretch on Wall  - 2 x daily - 7 x weekly - 1 sets - 3 reps - 30-60 sec hold - Standing Plantar Fascia Mobilization with Small Ball  - 1 x daily - 3 x weekly - 2 sets - 10 reps - Arch Stretch  - 2 x daily - 7 x weekly - 1 sets - 3 reps - 30 sec hold - Standing Calf Raise With Small Ball at Heels  - 1 x daily - 3 x weekly - 2 sets - 10 reps - Standing Bilateral Heel Raise on Step  - 1 x daily - 3 x weekly - 2 sets - 10-15 reps - Single Leg Balance with Clock Reach  - 1 x daily - 3 x weekly - 2 sets - 10 reps - Straight Leg Raise with External Rotation  - 1 x daily - 3 x weekly - 2 sets - 10 reps - Prone Quadriceps Stretch with Strap  - 2-3 x daily - 7 x weekly - 1 sets - 3 reps - 60 sec hold  ASSESSMENT:  CLINICAL IMPRESSION: 10/23/2023: Pt arrives w/ report of receiving MRI showing meniscal tear, states knee is bothering him more than foot at this point. He reports transient relief with stretching and heel raises but pain persists. Today we spend time discussing self care education as above, emphasis on mitigating risk  of reinjury and modifying based on activities. Also working on single limb stability tasks and progressing difficulty w/ calf strengthening program which he does well with. Noted difficulty w/ SL PF iso hold + mini squat (<20 deg) but no pain. No adverse events. Recommend continuing along current POC in order to address relevant deficits and improve functional tolerance. Pt departs today's session in no acute distress, all voiced questions/concerns addressed appropriately from PT perspective.     Eval:  Patient is a 38 y.o. male who was seen today for physical therapy evaluation and treatment for B plantar faciitis L> R. He also reports acute R knee pain which he feels is due to compensation from his L ankle.  He has marked tightness in B gastoc/soleus muscles affecting his ROM and ability to descend stairs. He has B hallux valgus and mild pronation of L foot. He has pain when standing and walking first thing in the morning and can no longer run due to R knee pain and swelling. Pain also limits his ability to play with his children. He will benefit from skilled PT to address these deficits.  .   OBJECTIVE IMPAIRMENTS: decreased activity tolerance, difficulty walking, decreased ROM, decreased strength, increased edema, increased muscle spasms, impaired flexibility, postural dysfunction, and pain.   ACTIVITY LIMITATIONS: standing, stairs, and locomotion level  PARTICIPATION LIMITATIONS: meal prep, cleaning, laundry, community activity, yard work, and playing with kids  PERSONAL FACTORS: Time since onset of injury/illness/exacerbation and 1 comorbidity: R knee pain are also affecting patient's functional outcome.   REHAB POTENTIAL: Excellent  CLINICAL DECISION MAKING: Evolving/moderate complexity  EVALUATION COMPLEXITY: Moderate   GOALS: Goals reviewed with patient? Yes  SHORT TERM GOALS: Target date: 10/30/2023 Patient will be independent with initial HEP. Baseline:  Goal status: INITIAL  2.  Decreased B heel pain by 50% with standing and walking in the morning Baseline:  Goal status: INITIAL  3.  Decreased R knee pain by 50% with ADLS Baseline:  Goal status: INITIAL   LONG TERM GOALS: Target date: 11/27/2023  Patient will be independent with advanced/ongoing HEP to improve outcomes and carryover.  Baseline:  Goal status: INITIAL  2.  Patient will report at least 85% improvement in B ankle/foot pain to improve QOL and allow return to playing with kids and running. Baseline:   Goal status: INITIAL  3.  Patient will demonstrate improved B ankle AROM to [facility] to allow for normal gait and stair mechanics. Baseline:  Goal status: INITIAL  4. Decreased knee pain by 85% with ADLs. Baseline:  Goal status:INITIAL  5.  Patient will be able to ascend/descend stairs with reciprocal step pattern and no UE support Baseline:  Goal status: INITIAL  6.  Improved PSFS by >= 2 points showing functional improvement Baseline: 3 Goal status: INITIAL     PLAN:  PT FREQUENCY: 2x/week  PT DURATION: 8 weeks  PLANNED INTERVENTIONS: 97164- PT Re-evaluation, 97110-Therapeutic exercises, 97530- Therapeutic activity, 97112- Neuromuscular re-education, 97535- Self Care, [id number]- Manual therapy, [id number]- Gait training, [phone]- Aquatic Therapy, [phone]- Electrical stimulation (unattended), 97016- Vasopneumatic device, [id number]- Ultrasound, [id number]- Ionotophoresis 4mg /ml Dexamethasone , [id number] (1-2 muscles), [id number] (3+ muscles)- Dry Needling, Patient/Family education, Balance training, Stair training, Taping, Joint mobilization, Cryotherapy, and Moist heat  PLAN FOR NEXT SESSION: Assess response to DN/MT f/u on knee, review HEP. Continue manual to gastroc/soleus and plantar fascia. Add hip flexor stretch to HEP, ankle and knee strength, work on balance also.   Alm DELENA Jenny PT, DPT 10/23/2023 1:01 PM

## 2023-10-23 ENCOUNTER — Ambulatory Visit: Attending: Podiatry | Admitting: Physical Therapy

## 2023-10-23 ENCOUNTER — Encounter: Payer: Self-pay | Admitting: Physical Therapy

## 2023-10-23 ENCOUNTER — Other Ambulatory Visit: Payer: Self-pay | Admitting: Sports Medicine

## 2023-10-23 DIAGNOSIS — M25571 Pain in right ankle and joints of right foot: Secondary | ICD-10-CM | POA: Diagnosis not present

## 2023-10-23 DIAGNOSIS — M25672 Stiffness of left ankle, not elsewhere classified: Secondary | ICD-10-CM | POA: Insufficient documentation

## 2023-10-23 DIAGNOSIS — M25572 Pain in left ankle and joints of left foot: Secondary | ICD-10-CM | POA: Diagnosis not present

## 2023-10-23 DIAGNOSIS — M25671 Stiffness of right ankle, not elsewhere classified: Secondary | ICD-10-CM | POA: Insufficient documentation

## 2023-10-23 DIAGNOSIS — M25561 Pain in right knee: Secondary | ICD-10-CM | POA: Insufficient documentation

## 2023-10-23 DIAGNOSIS — S83281A Other tear of lateral meniscus, current injury, right knee, initial encounter: Secondary | ICD-10-CM

## 2023-10-23 MED ORDER — MELOXICAM 15 MG PO TABS: 15.0000 mg | ORAL_TABLET | Freq: Every day | ORAL | 3 refills | Status: AC

## 2023-10-23 NOTE — Telephone Encounter (Signed)
 Sent!

## 2023-10-24 NOTE — Therapy (Signed)
 OUTPATIENT PHYSICAL THERAPY LOWER EXTREMITY TREATMENT   Patient Name: Victor Martinez MRN: [id number] DOB:[birth year], 38 y.o., male Today's Date: 10/25/2023  END OF SESSION:  PT End of Session - 10/25/23 0759     Visit Number 5    Date for PT Re-Evaluation 11/27/23    Authorization Type [employer]    PT Start Time 0800    PT Stop Time 0846    PT Time Calculation (min) 46 min    Activity Tolerance Patient tolerated treatment well              Past Medical History:  Diagnosis Date   ADHD (attention deficit hyperactivity disorder)    Mild   Allergies    Anxiety    Hyperlipidemia    Insomnia    Past Surgical History:  Procedure Laterality Date   NO PAST SURGERIES     Patient Active Problem List   Diagnosis Date Noted   Right knee meniscal tear 10/16/2023   PCP NOTES >>>>>>>>>>>>>>>>> 05/21/2021   Hyperlipidemia 05/20/2021   Other insomnia 02/22/2021   Annual physical exam 10/20/2014   Family hx of prostate cancer 10/20/2014   BMI 27.0-27.9,adult 10/20/2014   Attention deficit disorder 02/06/2007    PCP: Amon Aloysius BRAVO, MD   REFERRING PROVIDER: Joya Stabs, DPM  Curtis Debby PARAS, MD     REFERRING DIAG: M72.2 (ICD-10-CM) - Plantar fasciitis of left foot (Dr. Joya) [phone] (ICD-10-CM) - Other tear of lateral meniscus of right knee as current injury, initial encounter (Dr.Thekkekandam)  THERAPY DIAG:  Pain in left ankle and joints of left foot  Pain in right ankle and joints of right foot  Stiffness of left ankle, not elsewhere classified  Stiffness of right ankle, not elsewhere classified  Acute pain of right knee  Rationale for Evaluation and Treatment: Rehabilitation  ONSET DATE: January 2025  SUBJECTIVE:   SUBJECTIVE STATEMENT:   10/25/2023: tried to run yesterday - felt good for about a quarter mile, felt normal but after that began to develop sharp pain with landing. Also felt a little unstable prior to that. Was able to switch to  elliptical for 45 min without issues. felt good after last session. No pain today.  Eval: Always had tight achilles and calves. Used to run a lot. Left foot started to hurt and just has never gotten better. They both hurt in the morning. Doesn't feel it when he runs. Now his right knee is hurting and is swollen. Normally runs 2-3 x/wk for 3-5 miles. Has stopped due to R knee pain. Had injection in left heel helped for 2 days. Only takes anti-inflammatories if he is going to run. Wears insoles which have helped. Patient reports he changed shoes to [employer] back in January. He has returned to previous shoe for running [employer]).   PERTINENT HISTORY: unremarkable PAIN:  Are you having pain? 10/25/2023 see subjective above  Per eval:  Yes: NPRS scale: 2/10 at rest up to 8/10 Pain location: bottom of heel  Pain description: achy dull B to sharp if steps on it just so Aggravating factors: mornings Relieving factors: lie down  PRECAUTIONS: None  RED FLAGS: None   WEIGHT BEARING RESTRICTIONS: No  FALLS:  Has patient fallen in last 6 months? No  LIVING ENVIRONMENT: Lives with: lives with their family Lives in: House/apartment Stairs:    OCCUPATION: sits for work, minimal walking doesn't bother him much  PLOF: Independent and Leisure: playing soccer with kids; running  PATIENT GOALS: get the pain away  NEXT MD VISIT: one month  OBJECTIVE:  Note: Objective measures were completed at Evaluation unless otherwise noted.  DIAGNOSTIC FINDINGS:  XR LEFT FOOT IMPRESSION: 1. Mild to moderate hallux valgus. 2. Mild tibiotalar and talonavicular osteoarthritis. 3. Small plantar calcaneal heel spur.  XR RIGHT IMPRESSION: 1. Mild hallux valgus. 2. Minimal great toe metatarsophalangeal and talonavicular osteoarthritis. 3. Mild anterior tibiotalar osteoarthritis. 4. No plantar calcaneal heel spur  PATIENT SURVEYS:  PSFS: THE PATIENT SPECIFIC FUNCTIONAL SCALE  Place score of 0-10 (0 =  unable to perform activity and 10 = able to perform activity at the same level as before injury or problem)  Activity Date: 10/02/23    Walking barefoot 4    2.  Running 0    3. Getting out of bed and walking 4    4. Playing with kids  4    Total Score 3      Total Score = Sum of activity scores/number of activities  Minimally Detectable Change: 3 points (for single activity); 2 points (for average score)  [facility] (nd). The Patient Specific Functional Scale . Retrieved from [email or web address]   COGNITION: Overall cognitive status: Within functional limits for tasks assessed     EDEMA:  Visible edema in R knee compared to L  MUSCLE LENGTH: Marked gastoc/soleus tightness R>L  POSTURE: B hallux valgus. Mild pronation L >R   PALPATION: TTP at base of heels and medial plantar surface near heel on L  LOWER EXTREMITY ROM:  R knee 0-136  L 0-134 deg  A/P ROM Right eval Left eval  Ankle dorsiflexion -12/-5 0/10  Ankle plantarflexion 70 49/68  Ankle inversion    Ankle eversion     (Blank rows = not tested)  LOWER EXTREMITY MMT: 5/5 B ankles, B knee flex 4+5    GAIT: Ambulates on lateral side of foot on L  10/11/23 Knee assessed: full ROM, R HS 4+/5 in prone, TTP at medial patellar into MJL and medial patellar tendon Negative ant/post drawer, Apley compression and McMurray tests. 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 Crepitis with squats and pain with full extension. Noticeable edema compared to L knee.                                                                                                                               TREATMENT DATE:   [facility] Adult PT Treatment:                                                DATE: 10/25/23 Therapeutic Exercise: 20# suitcase carry 3 laps around gym BIL  Kickstand heel raise 2x15 BIL  65# SL leg press x12 BIL (ROM at 6) HEP update +  education/handout  Neuromuscular re-ed: Mini squat SL + PF iso hold (~20 deg bend) ; 2x30sec,  second bout w/ no knee bend and higher heel raise Pball hamstring curl x20, slow velocity cues for inc HS activation Pball bridge 2x15   Self Care: Continued education/discussion re: relevant anatomy/physiology, discussed risks/benefits of running and higher impact activities at this point and shared decision making, discussed typical progression of activities with end goal towards heavier impact tasks (isos/stability>strengthening>plyos/jogging>running), monitoring signs/symptoms and strategies to modify activities accordingly    [facility] Adult PT Treatment:                                                DATE: 10/23/23 Therapeutic Exercise: Soleus stretch at wall 2x30sec Drop step heel raises x20 Wall sit ~45 deg HEP update + discussion/education  Neuromuscular re-ed: Post tib heel raise ball between heels x12 PF mini squat iso hold (<20 deg knee bend) x30sec BIL  Self Care: Education/discussion re: relevant anatomy/physiology, individualizing exercise in context of arthritis, modifying activities based on symptom behavior, progression w/ PT thus far, strategies to mitigate risk of reinjury, basic load management principles    10/11/23 Assess R knee: see above B heel raises 10 sec hold x 10 B heel raises with ball b/w heels x 20 Unilateral heel raises 3 second pace up/hold/down x 10 Squat with heel raise x 10 SLS Stair taps 8 and steps one and two x 10 ea SLS 3 way reaches x 5 ea Soleus/gastroc stretch at stairs     10/09/23 B Gastroc stretch on incline board 2 x 30 sec  Soleus stretch 2x30 sec B Short foot arch raises in standing x 10 B heel raises 10 sec hold x 10 Unilateral heel raises 3 second pace up/hold/down x 10  Trigger Point Dry Needling  Initial Treatment: Pt instructed on Dry Needling rational, procedures, and possible side effects. Pt instructed to expect mild  to moderate muscle soreness later in the day and/or into the next day.  Pt instructed in methods to reduce muscle soreness. Pt instructed to continue prescribed HEP. Patient was educated on signs and symptoms of infection and other risk factors and advised to seek medical attention should they occur.  Patient verbalized understanding of these instructions and education.   Patient Verbal Consent Given: Yes Education Handout Provided: Yes Muscles Treated: L quadratus plantae, L gastroc, L soleus Electrical Stimulation Performed: No Treatment Response/Outcome: Utilized skilled palpation to identify bony landmarks and trigger points.  Able to illicit twitch response and muscle elongation.  Soft tissue mobilization and IASTM to muscles needled to further promote tissue elongation and decreased pain.        10/02/23  See pt ed and HEP   PATIENT EDUCATION:  Education details: rationale for interventions, HEP  Person educated: Patient Education method: Explanation, Demonstration, Tactile cues, Verbal cues Education comprehension: verbalized understanding, returned demonstration, verbal cues required, tactile cues required, and needs further education     HOME EXERCISE PROGRAM: Access Code: [email or web address] URL: [email or web address] Date: 10/25/2023 Prepared by: Alm Jenny  Exercises - Gastroc Stretch on Wall  - 2 x daily - 7 x weekly - 1 sets - 3 reps - 30-60 sec hold - Soleus Stretch on Wall  - 2 x daily - 7 x weekly - 1 sets - 3 reps - 30-60 sec hold - Standing Plantar Fascia Mobilization with Small Ball  - 1 x daily - 3 x weekly - 2 sets -  10 reps - Arch Stretch  - 2 x daily - 7 x weekly - 1 sets - 3 reps - 30 sec hold - Single Leg Balance with Clock Reach  - 1 x daily - 3 x weekly - 2 sets - 10 reps - Straight Leg Raise with External Rotation  - 1 x daily - 3 x weekly - 2 sets - 10 reps - Gastroc Heel Raise in Stride Stance Position  - 2-3 x daily - 1 sets - 10-15 reps -  Soleus Heel Raise in Stride Stance Position  - 2-3 x daily - 1 sets - 1 reps - 20-30sec hold hold  ASSESSMENT:  CLINICAL IMPRESSION: 10/25/2023: Pt arrives w/ no pain - no issues after last session but describes attempt to run yesterday (see subjective). Thorough education provided on this topic as above. Appreciate referral from Dr. Curtis for his knee, so today we focus more so on this with emphasis on quad/hamstring stability in open/closed chain. We are able to progress calf complex strengthening as well. Concurrent education throughout to maximize self efficacy with activity outside of sessions. No adverse events, some fatigue particularly with closed chain tasks but no increase in resting pain and improved performance compared to last session. Recommend continuing along current POC in order to address relevant deficits and improve functional tolerance. Pt departs today's session in no acute distress, all voiced questions/concerns addressed appropriately from PT perspective.     Eval: Patient is a 38 y.o. male who was seen today for physical therapy evaluation and treatment for B plantar faciitis L> R. He also reports acute R knee pain which he feels is due to compensation from his L ankle.  He has marked tightness in B gastoc/soleus muscles affecting his ROM and ability to descend stairs. He has B hallux valgus and mild pronation of L foot. He has pain when standing and walking first thing in the morning and can no longer run due to R knee pain and swelling. Pain also limits his ability to play with his children. He will benefit from skilled PT to address these deficits.  .   OBJECTIVE IMPAIRMENTS: decreased activity tolerance, difficulty walking, decreased ROM, decreased strength, increased edema, increased muscle spasms, impaired flexibility, postural dysfunction, and pain.   ACTIVITY LIMITATIONS: standing, stairs, and locomotion level  PARTICIPATION LIMITATIONS: meal prep, cleaning,  laundry, community activity, yard work, and playing with kids  PERSONAL FACTORS: Time since onset of injury/illness/exacerbation and 1 comorbidity: R knee pain are also affecting patient's functional outcome.   REHAB POTENTIAL: Excellent  CLINICAL DECISION MAKING: Evolving/moderate complexity  EVALUATION COMPLEXITY: Moderate   GOALS: Goals reviewed with patient? Yes  SHORT TERM GOALS: Target date: 10/30/2023  Patient will be independent with initial HEP. Baseline:  Goal status: INITIAL  2.  Decreased B heel pain by 50% with standing and walking in the morning Baseline:  Goal status: INITIAL  3.  Decreased R knee pain by 50% with ADLS Baseline:  Goal status: INITIAL   LONG TERM GOALS: Target date: 11/27/2023    Patient will be independent with advanced/ongoing HEP to improve outcomes and carryover.  Baseline:  Goal status: INITIAL  2.  Patient will report at least 85% improvement in B ankle/foot pain to improve QOL and allow return to playing with kids and running. Baseline:  Goal status: INITIAL  3.  Patient will demonstrate improved B ankle AROM to [facility] to allow for normal gait and stair mechanics. Baseline:  Goal status: INITIAL  4. Decreased  knee pain by 85% with ADLs. Baseline:  Goal status:INITIAL  5.  Patient will be able to ascend/descend stairs with reciprocal step pattern and no UE support Baseline:  Goal status: INITIAL  6.  Improved PSFS by >= 2 points showing functional improvement Baseline: 3 Goal status: INITIAL     PLAN:  PT FREQUENCY: 2x/week  PT DURATION: 8 weeks  PLANNED INTERVENTIONS: 97164- PT Re-evaluation, 97110-Therapeutic exercises, 97530- Therapeutic activity, 97112- Neuromuscular re-education, 97535- Self Care, [id number]- Manual therapy, [id number]- Gait training, [phone]- Aquatic Therapy, [id number]- Electrical stimulation (unattended), 97016- Vasopneumatic device, [id number]- Ultrasound, [id number]- Ionotophoresis 4mg /ml Dexamethasone , 79439 (1-2  muscles), 20561 (3+ muscles)- Dry Needling, Patient/Family education, Balance training, Stair training, Taping, Joint mobilization, Cryotherapy, and Moist heat  PLAN FOR NEXT SESSION: continue with gradual progression isos/stability>strengthening>plyo/jogging as able/tolerated. Continue concurrent education. Gradually progress knee ROM with quad/hamstring strengthening within pt tolerance   Alm DELENA Jenny PT, DPT 10/25/2023 10:42 AM

## 2023-10-25 ENCOUNTER — Ambulatory Visit: Admitting: Physical Therapy

## 2023-10-25 ENCOUNTER — Encounter: Payer: Self-pay | Admitting: Physical Therapy

## 2023-10-25 DIAGNOSIS — M25572 Pain in left ankle and joints of left foot: Secondary | ICD-10-CM | POA: Diagnosis not present

## 2023-10-25 DIAGNOSIS — M25561 Pain in right knee: Secondary | ICD-10-CM | POA: Diagnosis not present

## 2023-10-25 DIAGNOSIS — M25671 Stiffness of right ankle, not elsewhere classified: Secondary | ICD-10-CM

## 2023-10-25 DIAGNOSIS — M25672 Stiffness of left ankle, not elsewhere classified: Secondary | ICD-10-CM | POA: Diagnosis not present

## 2023-10-25 DIAGNOSIS — M25571 Pain in right ankle and joints of right foot: Secondary | ICD-10-CM | POA: Diagnosis not present

## 2023-10-29 NOTE — Therapy (Addendum)
 OUTPATIENT PHYSICAL THERAPY LOWER EXTREMITY TREATMENT + NO VISIT DISCHARGE SUMMARY (see below)    Patient Name: Victor Martinez MRN: [id number] DOB:January 27, 1986, 38 y.o., male Today's Date: 10/30/2023  END OF SESSION:  PT End of Session - 10/30/23 0758     Visit Number 6    Date for PT Re-Evaluation 11/27/23    Authorization Type [employer]    PT Start Time 0800    PT Stop Time 0844    PT Time Calculation (min) 44 min    Activity Tolerance Patient tolerated treatment well               Past Medical History:  Diagnosis Date   ADHD (attention deficit hyperactivity disorder)    Mild   Allergies    Anxiety    Hyperlipidemia    Insomnia    Past Surgical History:  Procedure Laterality Date   NO PAST SURGERIES     Patient Active Problem List   Diagnosis Date Noted   Right knee meniscal tear 10/16/2023   PCP NOTES >>>>>>>>>>>>>>>>> 05/21/2021   Hyperlipidemia 05/20/2021   Other insomnia 02/22/2021   Annual physical exam 10/20/2014   Family hx of prostate cancer 10/20/2014   BMI 27.0-27.9,adult 10/20/2014   Attention deficit disorder 02/06/2007    PCP: Amon Aloysius BRAVO, MD   REFERRING PROVIDER: Joya Stabs, DPM  Curtis Debby PARAS, MD     REFERRING DIAG: M72.2 (ICD-10-CM) - Plantar fasciitis of left foot (Dr. Joya) [phone] (ICD-10-CM) - Other tear of lateral meniscus of right knee as current injury, initial encounter (Dr.Thekkekandam)  THERAPY DIAG:  Pain in left ankle and joints of left foot  Pain in right ankle and joints of right foot  Stiffness of left ankle, not elsewhere classified  Stiffness of right ankle, not elsewhere classified  Acute pain of right knee  Rationale for Evaluation and Treatment: Rehabilitation  ONSET DATE: January 2025  SUBJECTIVE:   SUBJECTIVE STATEMENT:   10/30/2023: worked in the yard over the weekend. Tried to jog again, about a quarter mile, stopped prior to onset of sharp pain. Not having as much foot pain at this  point but is still bothering him in mornings. Still having some knee pain descending stairs, but that's the only time it really bothers him unless he runs or squats.    Eval: Always had tight achilles and calves. Used to run a lot. Left foot started to hurt and just has never gotten better. They both hurt in the morning. Doesn't feel it when he runs. Now his right knee is hurting and is swollen. Normally runs 2-3 x/wk for 3-5 miles. Has stopped due to R knee pain. Had injection in left heel helped for 2 days. Only takes anti-inflammatories if he is going to run. Wears insoles which have helped. Patient reports he changed shoes to [employer] back in January. He has returned to previous shoe for running [employer]).   PERTINENT HISTORY: unremarkable PAIN:  Are you having pain? 10/30/2023 see subjective above  Per eval:  Yes: NPRS scale: 2/10 at rest up to 8/10 Pain location: bottom of heel  Pain description: achy dull B to sharp if steps on it just so Aggravating factors: mornings Relieving factors: lie down  PRECAUTIONS: None  RED FLAGS: None   WEIGHT BEARING RESTRICTIONS: No  FALLS:  Has patient fallen in last 6 months? No  LIVING ENVIRONMENT: Lives with: lives with their family Lives in: House/apartment Stairs:    OCCUPATION: sits for work, minimal walking doesn't bother  him much  PLOF: Independent and Leisure: playing soccer with kids; running  PATIENT GOALS: get the pain away  NEXT MD VISIT: one month  OBJECTIVE:  Note: Objective measures were completed at Evaluation unless otherwise noted.  DIAGNOSTIC FINDINGS:  XR LEFT FOOT IMPRESSION: 1. Mild to moderate hallux valgus. 2. Mild tibiotalar and talonavicular osteoarthritis. 3. Small plantar calcaneal heel spur.  XR RIGHT IMPRESSION: 1. Mild hallux valgus. 2. Minimal great toe metatarsophalangeal and talonavicular osteoarthritis. 3. Mild anterior tibiotalar osteoarthritis. 4. No plantar calcaneal heel spur  PATIENT  SURVEYS:  PSFS: THE PATIENT SPECIFIC FUNCTIONAL SCALE  Place score of 0-10 (0 = unable to perform activity and 10 = able to perform activity at the same level as before injury or problem)  Activity Date: 10/02/23    Walking barefoot 4    2.  Running 0    3. Getting out of bed and walking 4    4. Playing with kids  4    Total Score 3      Total Score = Sum of activity scores/number of activities  Minimally Detectable Change: 3 points (for single activity); 2 points (for average score)  [facility] (nd). The Patient Specific Functional Scale . Retrieved from [email or web address]   COGNITION: Overall cognitive status: Within functional limits for tasks assessed     EDEMA:  Visible edema in R knee compared to L  MUSCLE LENGTH: Marked gastoc/soleus tightness R>L  POSTURE: B hallux valgus. Mild pronation L >R   PALPATION: TTP at base of heels and medial plantar surface near heel on L  LOWER EXTREMITY ROM:  R knee 0-136  L 0-134 deg  A/P ROM Right eval Left eval  Ankle dorsiflexion -12/-5 0/10  Ankle plantarflexion 70 49/68  Ankle inversion    Ankle eversion     (Blank rows = not tested)  LOWER EXTREMITY MMT: 5/5 B ankles, B knee flex 4+5    GAIT: Ambulates on lateral side of foot on L  10/11/23 Knee assessed: full ROM, R HS 4+/5 in prone, TTP at medial patellar into MJL and medial patellar tendon Negative ant/post drawer, Apley compression and McMurray tests. 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 Crepitis with squats and pain with full extension. Noticeable edema compared to L knee.                                                                                                                               TREATMENT DATE:   [facility] Adult PT Treatment:                                                DATE: 10/30/23 Therapeutic Exercise:  - 20# suitcase carry 4 laps each UE (warm up) SL heel raise w/ unilat  UE support 2x20 each SL leg press  65#, 3-3-3 tempo (ROM at 6) x20 (low RPE) SL leg press 90# BIL, ROM at 6, x15 HEP update + education/handout, education re: RIR/RPE as measurement of fatigue w/ exercise  Neuromuscular re-ed: Lateral walking with heel raise iso hold 2x5 laps along counter cues for frontal plane alignment and post tib activation Kickstand wall sit 2x30sec BIL    [facility] Adult PT Treatment:                                                DATE: 10/25/23 Therapeutic Exercise: 20# suitcase carry 3 laps around gym BIL  Kickstand heel raise 2x15 BIL  65# SL leg press x12 BIL (ROM at 6) HEP update + education/handout  Neuromuscular re-ed: Mini squat SL + PF iso hold (~20 deg bend) ; 2x30sec, second bout w/ no knee bend and higher heel raise Pball hamstring curl x20, slow velocity cues for inc HS activation Pball bridge 2x15   Self Care: Continued education/discussion re: relevant anatomy/physiology, discussed risks/benefits of running and higher impact activities at this point and shared decision making, discussed typical progression of activities with end goal towards heavier impact tasks (isos/stability>strengthening>plyos/jogging>running), monitoring signs/symptoms and strategies to modify activities accordingly    [facility] Adult PT Treatment:                                                DATE: 10/23/23 Therapeutic Exercise: Soleus stretch at wall 2x30sec Drop step heel raises x20 Wall sit ~45 deg HEP update + discussion/education  Neuromuscular re-ed: Post tib heel raise ball between heels x12 PF mini squat iso hold (<20 deg knee bend) x30sec BIL  Self Care: Education/discussion re: relevant anatomy/physiology, individualizing exercise in context of arthritis, modifying activities based on symptom behavior, progression w/ PT thus far, strategies to mitigate risk of reinjury, basic load management principles    10/11/23 Assess R knee: see above B heel raises 10 sec  hold x 10 B heel raises with ball b/w heels x 20 Unilateral heel raises 3 second pace up/hold/down x 10 Squat with heel raise x 10 SLS Stair taps 8 and steps one and two x 10 ea SLS 3 way reaches x 5 ea Soleus/gastroc stretch at stairs     10/09/23 B Gastroc stretch on incline board 2 x 30 sec  Soleus stretch 2x30 sec B Short foot arch raises in standing x 10 B heel raises 10 sec hold x 10 Unilateral heel raises 3 second pace up/hold/down x 10  Trigger Point Dry Needling  Initial Treatment: Pt instructed on Dry Needling rational, procedures, and possible side effects. Pt instructed to expect mild to moderate muscle soreness later in the day and/or into the next day.  Pt instructed in methods to reduce muscle soreness. Pt instructed to continue prescribed HEP. Patient was educated on signs and symptoms of infection and other risk factors and advised to seek medical attention should they occur.  Patient verbalized understanding of these instructions and education.   Patient Verbal Consent Given: Yes Education Handout Provided: Yes Muscles Treated: L quadratus plantae, L gastroc, L soleus Electrical Stimulation Performed: No Treatment Response/Outcome: Utilized skilled palpation to identify bony landmarks and trigger points.  Able to illicit twitch response and muscle  elongation.  Soft tissue mobilization and IASTM to muscles needled to further promote tissue elongation and decreased pain.        10/02/23  See pt ed and HEP   PATIENT EDUCATION:  Education details: rationale for interventions, HEP  Person educated: Patient Education method: Explanation, Demonstration, Tactile cues, Verbal cues Education comprehension: verbalized understanding, returned demonstration, verbal cues required, tactile cues required, and needs further education     HOME EXERCISE PROGRAM: Access Code: [email or web address] URL: [email or web address] Date: 10/30/2023 Prepared by: Alm Jenny  Exercises - Straight Leg Raise with External Rotation  - 1 x daily - 3 x weekly - 2 sets - 10 reps - Gastroc Heel Raise in Stride Stance Position  - 2-3 x daily - 1 sets - 10-15 reps - Soleus Heel Raise in Stride Stance Position  - 2-3 x daily - 1 sets - 1 reps - 20-30sec hold hold - Side Stepping with Counter Support  - 2-3 x daily - 1 sets - 5 reps - Standing Single Leg Heel Raise  - 3-4 x weekly - 2 sets - 20-30 reps - Unilateral Wall Sit  - 3-4 x weekly - 2 sets - 1 reps - 30sec hold  ASSESSMENT:  CLINICAL IMPRESSION: 10/30/2023: Pt arrives w/o pain, no issues since last session and good HEP performance. Today we continue progressing single limb calf strength/endurance which he tolerates well with muscular fatigue as expected. Also progressing knee/hip isometrics in tolerated ROM, continues to have instability/fatigue in deeper ranges of flexion but no increase in resting pain. No adverse events, no pain on departure. HEP update as above. Recommend continuing along current POC in order to address relevant deficits and improve functional tolerance. Pt departs today's session in no acute distress, all voiced questions/concerns addressed appropriately from PT perspective.    Eval: Patient is a 38 y.o. male who was seen today for physical therapy evaluation and treatment for B plantar faciitis L> R. He also reports acute R knee pain which he feels is due to compensation from his L ankle.  He has marked tightness in B gastoc/soleus muscles affecting his ROM and ability to descend stairs. He has B hallux valgus and mild pronation of L foot. He has pain when standing and walking first thing in the morning and can no longer run due to R knee pain and swelling. Pain also limits his ability to play with his children. He will benefit from skilled PT to address these deficits.  .   OBJECTIVE IMPAIRMENTS: decreased activity tolerance, difficulty walking, decreased ROM, decreased strength, increased  edema, increased muscle spasms, impaired flexibility, postural dysfunction, and pain.   ACTIVITY LIMITATIONS: standing, stairs, and locomotion level  PARTICIPATION LIMITATIONS: meal prep, cleaning, laundry, community activity, yard work, and playing with kids  PERSONAL FACTORS: Time since onset of injury/illness/exacerbation and 1 comorbidity: R knee pain are also affecting patient's functional outcome.   REHAB POTENTIAL: Excellent  CLINICAL DECISION MAKING: Evolving/moderate complexity  EVALUATION COMPLEXITY: Moderate   GOALS: Goals reviewed with patient? Yes  SHORT TERM GOALS: Target date: 10/30/2023  Patient will be independent with initial HEP. Baseline:  10/30/23: reports excellent HEP performance Goal status: MET  2.  Decreased B heel pain by 50% with standing and walking in the morning Baseline:  10/30/23: variable improvement, less sharp pain Goal status: PROGRESSING  3.  Decreased R knee pain by 50% with ADLS Baseline:  10/30/23: no pain during majority of daily tasks, mostly with deeper squatting and descending stairs Goal status:  PROGRESSING   LONG TERM GOALS: Target date: 11/27/2023    Patient will be independent with advanced/ongoing HEP to improve outcomes and carryover.  Baseline:  Goal status: INITIAL  2.  Patient will report at least 85% improvement in B ankle/foot pain to improve QOL and allow return to playing with kids and running. Baseline:  Goal status: INITIAL  3.  Patient will demonstrate improved B ankle AROM to [facility] to allow for normal gait and stair mechanics. Baseline:  Goal status: INITIAL  4. Decreased knee pain by 85% with ADLs. Baseline:  Goal status:INITIAL  5.  Patient will be able to ascend/descend stairs with reciprocal step pattern and no UE support Baseline:  Goal status: INITIAL  6.  Improved PSFS by >= 2 points showing functional improvement Baseline: 3 Goal status: INITIAL     PLAN:  PT FREQUENCY: 2x/week  PT  DURATION: 8 weeks  PLANNED INTERVENTIONS: 97164- PT Re-evaluation, 97110-Therapeutic exercises, 97530- Therapeutic activity, 97112- Neuromuscular re-education, 97535- Self Care, [id number]- Manual therapy, [id number]- Gait training, [phone]- Aquatic Therapy, [phone]- Electrical stimulation (unattended), 97016- Vasopneumatic device, [id number]- Ultrasound, [id number]- Ionotophoresis 4mg /ml Dexamethasone , [id number] (1-2 muscles), [id number] (3+ muscles)- Dry Needling, Patient/Family education, Balance training, Stair training, Taping, Joint mobilization, Cryotherapy, and Moist heat  PLAN FOR NEXT SESSION: continue with gradual progression isos/stability>strengthening>plyo/jogging as able/tolerated. Continue concurrent education. Gradually progress knee ROM with quad/hamstring strengthening within pt tolerance   Alm DELENA Jenny PT, DPT 10/30/2023 11:37 AM     Discharge addendum 02/20/2024  PHYSICAL THERAPY DISCHARGE SUMMARY  Visits from Start of Care: 6  Current functional level related to goals / functional outcomes: Unable to be assessed   Remaining deficits: Unable to be assessed   Education / Equipment: Unable to be assessed  Patient goals were unable to be assessed. Patient is being discharged due to not returning since the last visit.  Alm DELENA Jenny PT, DPT 02/20/2024 8:19 AM

## 2023-10-30 ENCOUNTER — Ambulatory Visit: Admitting: Physical Therapy

## 2023-10-30 ENCOUNTER — Encounter: Payer: Self-pay | Admitting: Physical Therapy

## 2023-10-30 DIAGNOSIS — M25572 Pain in left ankle and joints of left foot: Secondary | ICD-10-CM | POA: Diagnosis not present

## 2023-10-30 DIAGNOSIS — M25561 Pain in right knee: Secondary | ICD-10-CM

## 2023-10-30 DIAGNOSIS — M25571 Pain in right ankle and joints of right foot: Secondary | ICD-10-CM | POA: Diagnosis not present

## 2023-10-30 DIAGNOSIS — M25671 Stiffness of right ankle, not elsewhere classified: Secondary | ICD-10-CM

## 2023-10-30 DIAGNOSIS — M25672 Stiffness of left ankle, not elsewhere classified: Secondary | ICD-10-CM | POA: Diagnosis not present

## 2023-11-01 ENCOUNTER — Ambulatory Visit: Admitting: Podiatry

## 2023-11-06 ENCOUNTER — Encounter: Admitting: Physical Therapy

## 2023-11-08 ENCOUNTER — Encounter: Admitting: Physical Therapy

## 2023-11-13 ENCOUNTER — Encounter: Payer: Self-pay | Admitting: Sports Medicine

## 2023-11-13 ENCOUNTER — Ambulatory Visit: Admitting: Physical Therapy

## 2023-11-13 NOTE — Therapy (Incomplete)
 OUTPATIENT PHYSICAL THERAPY LOWER EXTREMITY TREATMENT   Patient Name: Victor Martinez MRN: [id number] DOB:06-11-1985, 38 y.o., male Today's Date: 11/13/2023  END OF SESSION:         Past Medical History:  Diagnosis Date   ADHD (attention deficit hyperactivity disorder)    Mild   Allergies    Anxiety    Hyperlipidemia    Insomnia    Past Surgical History:  Procedure Laterality Date   NO PAST SURGERIES     Patient Active Problem List   Diagnosis Date Noted   Right knee meniscal tear 10/16/2023   PCP NOTES >>>>>>>>>>>>>>>>> 05/21/2021   Hyperlipidemia 05/20/2021   Other insomnia 02/22/2021   Annual physical exam 10/20/2014   Family hx of prostate cancer 10/20/2014   BMI 27.0-27.9,adult 10/20/2014   Attention deficit disorder 02/06/2007    PCP: Amon Aloysius BRAVO, MD   REFERRING PROVIDER: Joya Stabs, DPM  Curtis Debby PARAS, MD     REFERRING DIAG: M72.2 (ICD-10-CM) - Plantar fasciitis of left foot (Dr. Joya) [phone] (ICD-10-CM) - Other tear of lateral meniscus of right knee as current injury, initial encounter (Dr.Thekkekandam)  THERAPY DIAG:  No diagnosis found.  Rationale for Evaluation and Treatment: Rehabilitation  ONSET DATE: January 2025  SUBJECTIVE:   SUBJECTIVE STATEMENT:   11/13/2023: ***  *** worked in the yard over the weekend. Tried to jog again, about a quarter mile, stopped prior to onset of sharp pain. Not having as much foot pain at this point but is still bothering him in mornings. Still having some knee pain descending stairs, but that's the only time it really bothers him unless he runs or squats.    Eval: Always had tight achilles and calves. Used to run a lot. Left foot started to hurt and just has never gotten better. They both hurt in the morning. Doesn't feel it when he runs. Now his right knee is hurting and is swollen. Normally runs 2-3 x/wk for 3-5 miles. Has stopped due to R knee pain. Had injection in left heel helped  for 2 days. Only takes anti-inflammatories if he is going to run. Wears insoles which have helped. Patient reports he changed shoes to [employer] back in January. He has returned to previous shoe for running [employer]).   PERTINENT HISTORY: unremarkable PAIN:  Are you having pain? 11/13/2023 see subjective above  Per eval:  Yes: NPRS scale: 2/10 at rest up to 8/10 Pain location: bottom of heel  Pain description: achy dull B to sharp if steps on it just so Aggravating factors: mornings Relieving factors: lie down  PRECAUTIONS: None  RED FLAGS: None   WEIGHT BEARING RESTRICTIONS: No  FALLS:  Has patient fallen in last 6 months? No  LIVING ENVIRONMENT: Lives with: lives with their family Lives in: House/apartment Stairs:    OCCUPATION: sits for work, minimal walking doesn't bother him much  PLOF: Independent and Leisure: playing soccer with kids; running  PATIENT GOALS: get the pain away  NEXT MD VISIT: one month  OBJECTIVE:  Note: Objective measures were completed at Evaluation unless otherwise noted.  DIAGNOSTIC FINDINGS:  XR LEFT FOOT IMPRESSION: 1. Mild to moderate hallux valgus. 2. Mild tibiotalar and talonavicular osteoarthritis. 3. Small plantar calcaneal heel spur.  XR RIGHT IMPRESSION: 1. Mild hallux valgus. 2. Minimal great toe metatarsophalangeal and talonavicular osteoarthritis. 3. Mild anterior tibiotalar osteoarthritis. 4. No plantar calcaneal heel spur  PATIENT SURVEYS:  PSFS: THE PATIENT SPECIFIC FUNCTIONAL SCALE  Place score of 0-10 (0 = unable to  perform activity and 10 = able to perform activity at the same level as before injury or problem)  Activity Date: 10/02/23    Walking barefoot 4    2.  Running 0    3. Getting out of bed and walking 4    4. Playing with kids  4    Total Score 3      Total Score = Sum of activity scores/number of activities  Minimally Detectable Change: 3 points (for single activity); 2 points (for average  score)  [facility] (nd). The Patient Specific Functional Scale . Retrieved from [email or web address]   COGNITION: Overall cognitive status: Within functional limits for tasks assessed     EDEMA:  Visible edema in R knee compared to L  MUSCLE LENGTH: Marked gastoc/soleus tightness R>L  POSTURE: B hallux valgus. Mild pronation L >R   PALPATION: TTP at base of heels and medial plantar surface near heel on L  LOWER EXTREMITY ROM:  R knee 0-136  L 0-134 deg  A/P ROM Right eval Left eval  Ankle dorsiflexion -12/-5 0/10  Ankle plantarflexion 70 49/68  Ankle inversion    Ankle eversion     (Blank rows = not tested)  LOWER EXTREMITY MMT: 5/5 B ankles, B knee flex 4+5    GAIT: Ambulates on lateral side of foot on L  10/11/23 Knee assessed: full ROM, R HS 4+/5 in prone, TTP at medial patellar into MJL and medial patellar tendon Negative ant/post drawer, Apley compression and McMurray tests. 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 Crepitis with squats and pain with full extension. Noticeable edema compared to L knee.                                                                                                                               TREATMENT DATE:   [facility] Adult PT Treatment:                                                DATE: 11/13/23 Therapeutic Exercise: *** Manual Therapy: *** Neuromuscular re-ed: *** Therapeutic Activity: *** Modalities: *** Self Care: ***    [facility] Adult PT Treatment:                                                DATE: 10/30/23 Therapeutic Exercise:  - 20# suitcase carry 4 laps each UE (warm up) SL heel raise w/ unilat UE support 2x20 each SL leg press 65#, 3-3-3 tempo (ROM at 6) x20 (low RPE) SL leg press 90# BIL, ROM at 6, x15 HEP update + education/handout, education re: RIR/RPE as measurement of fatigue w/ exercise  Neuromuscular re-ed: Lateral walking with heel  raise  iso hold 2x5 laps along counter cues for frontal plane alignment and post tib activation Kickstand wall sit 2x30sec BIL    [facility] Adult PT Treatment:                                                DATE: 10/25/23 Therapeutic Exercise: 20# suitcase carry 3 laps around gym BIL  Kickstand heel raise 2x15 BIL  65# SL leg press x12 BIL (ROM at 6) HEP update + education/handout  Neuromuscular re-ed: Mini squat SL + PF iso hold (~20 deg bend) ; 2x30sec, second bout w/ no knee bend and higher heel raise Pball hamstring curl x20, slow velocity cues for inc HS activation Pball bridge 2x15   Self Care: Continued education/discussion re: relevant anatomy/physiology, discussed risks/benefits of running and higher impact activities at this point and shared decision making, discussed typical progression of activities with end goal towards heavier impact tasks (isos/stability>strengthening>plyos/jogging>running), monitoring signs/symptoms and strategies to modify activities accordingly    [facility] Adult PT Treatment:                                                DATE: 10/23/23 Therapeutic Exercise: Soleus stretch at wall 2x30sec Drop step heel raises x20 Wall sit ~45 deg HEP update + discussion/education  Neuromuscular re-ed: Post tib heel raise ball between heels x12 PF mini squat iso hold (<20 deg knee bend) x30sec BIL  Self Care: Education/discussion re: relevant anatomy/physiology, individualizing exercise in context of arthritis, modifying activities based on symptom behavior, progression w/ PT thus far, strategies to mitigate risk of reinjury, basic load management principles    PATIENT EDUCATION:  Education details: rationale for interventions, HEP  Person educated: Patient Education method: Explanation, Demonstration, Tactile cues, Verbal cues Education comprehension: verbalized understanding, returned demonstration, verbal cues required, tactile cues required, and needs further  education     HOME EXERCISE PROGRAM: Access Code: [email or web address] URL: [email or web address] Date: 10/30/2023 Prepared by: Alm Jenny  Exercises - Straight Leg Raise with External Rotation  - 1 x daily - 3 x weekly - 2 sets - 10 reps - Gastroc Heel Raise in Stride Stance Position  - 2-3 x daily - 1 sets - 10-15 reps - Soleus Heel Raise in Stride Stance Position  - 2-3 x daily - 1 sets - 1 reps - 20-30sec hold hold - Side Stepping with Counter Support  - 2-3 x daily - 1 sets - 5 reps - Standing Single Leg Heel Raise  - 3-4 x weekly - 2 sets - 20-30 reps - Unilateral Wall Sit  - 3-4 x weekly - 2 sets - 1 reps - 30sec hold  ASSESSMENT:  CLINICAL IMPRESSION: 11/13/2023: ***  *** Pt arrives w/o pain, no issues since last session and good HEP performance. Today we continue progressing single limb calf strength/endurance which he tolerates well with muscular fatigue as expected. Also progressing knee/hip isometrics in tolerated ROM, continues to have instability/fatigue in deeper ranges of flexion but no increase in resting pain. No adverse events, no pain on departure. HEP update as above. Recommend continuing along current POC in order to address relevant deficits and improve functional tolerance. Pt departs today's session in no acute distress, all voiced questions/concerns  addressed appropriately from PT perspective.    Eval: Patient is a 38 y.o. male who was seen today for physical therapy evaluation and treatment for B plantar faciitis L> R. He also reports acute R knee pain which he feels is due to compensation from his L ankle.  He has marked tightness in B gastoc/soleus muscles affecting his ROM and ability to descend stairs. He has B hallux valgus and mild pronation of L foot. He has pain when standing and walking first thing in the morning and can no longer run due to R knee pain and swelling. Pain also limits his ability to play with his children. He will benefit from skilled PT  to address these deficits.  .   OBJECTIVE IMPAIRMENTS: decreased activity tolerance, difficulty walking, decreased ROM, decreased strength, increased edema, increased muscle spasms, impaired flexibility, postural dysfunction, and pain.   ACTIVITY LIMITATIONS: standing, stairs, and locomotion level  PARTICIPATION LIMITATIONS: meal prep, cleaning, laundry, community activity, yard work, and playing with kids  PERSONAL FACTORS: Time since onset of injury/illness/exacerbation and 1 comorbidity: R knee pain are also affecting patient's functional outcome.   REHAB POTENTIAL: Excellent  CLINICAL DECISION MAKING: Evolving/moderate complexity  EVALUATION COMPLEXITY: Moderate   GOALS: Goals reviewed with patient? Yes  SHORT TERM GOALS: Target date: 10/30/2023  Patient will be independent with initial HEP. Baseline:  10/30/23: reports excellent HEP performance Goal status: MET  2.  Decreased B heel pain by 50% with standing and walking in the morning Baseline:  10/30/23: variable improvement, less sharp pain Goal status: PROGRESSING  3.  Decreased R knee pain by 50% with ADLS Baseline:  10/30/23: no pain during majority of daily tasks, mostly with deeper squatting and descending stairs Goal status: PROGRESSING   LONG TERM GOALS: Target date: 11/27/2023    Patient will be independent with advanced/ongoing HEP to improve outcomes and carryover.  Baseline:  Goal status: INITIAL  2.  Patient will report at least 85% improvement in B ankle/foot pain to improve QOL and allow return to playing with kids and running. Baseline:  Goal status: INITIAL  3.  Patient will demonstrate improved B ankle AROM to [facility] to allow for normal gait and stair mechanics. Baseline:  Goal status: INITIAL  4. Decreased knee pain by 85% with ADLs. Baseline:  Goal status:INITIAL  5.  Patient will be able to ascend/descend stairs with reciprocal step pattern and no UE support Baseline:  Goal status:  INITIAL  6.  Improved PSFS by >= 2 points showing functional improvement Baseline: 3 Goal status: INITIAL     PLAN:  PT FREQUENCY: 2x/week  PT DURATION: 8 weeks  PLANNED INTERVENTIONS: 97164- PT Re-evaluation, 97110-Therapeutic exercises, 97530- Therapeutic activity, 97112- Neuromuscular re-education, 97535- Self Care, 02859- Manual therapy, [id number]- Gait training, [phone]- Aquatic Therapy, [phone]- Electrical stimulation (unattended), 97016- Vasopneumatic device, [id number]- Ultrasound, [id number]- Ionotophoresis 4mg /ml Dexamethasone , 79439 (1-2 muscles), 20561 (3+ muscles)- Dry Needling, Patient/Family education, Balance training, Stair training, Taping, Joint mobilization, Cryotherapy, and Moist heat  PLAN FOR NEXT SESSION: continue with gradual progression isos/stability>strengthening>plyo/jogging as able/tolerated. Continue concurrent education. Gradually progress knee ROM with quad/hamstring strengthening within pt tolerance   Alm DELENA Jenny PT, DPT 11/13/2023 7:53 AM

## 2023-11-26 ENCOUNTER — Encounter: Payer: Self-pay | Admitting: Physical Therapy

## 2023-12-12 ENCOUNTER — Ambulatory Visit

## 2023-12-12 VITALS — BP 134/84 | Ht 72.0 in | Wt 220.0 lb

## 2023-12-12 DIAGNOSIS — S83261D Peripheral tear of lateral meniscus, current injury, right knee, subsequent encounter: Secondary | ICD-10-CM

## 2023-12-12 DIAGNOSIS — M25469 Effusion, unspecified knee: Secondary | ICD-10-CM

## 2023-12-12 NOTE — Progress Notes (Signed)
   Subjective:    Patient ID: Victor Martinez, male    DOB: 38 y.o., May 12, 1985   MRN: [id number]  HPI  Chief Complaint: Right knee pain  10/16/2023 MRI right knee: FINDINGS: Mild myxoid degeneration anterior cruciate ligament with intact fibers. The posterior cruciate ligament, medial collateral ligament, and lateral collateral ligament are intact. There is a small tear to the anterior horn of the lateral meniscus. The medial meniscus is unremarkable.   Mild to moderate medial compartment chondromalacia and mild patellofemoral compartment chondromalacia. No degenerative edema. Mild to moderate joint effusion. Suggestion of some lateral tracking of the patella.   IMPRESSION: Small nondisplaced tear anterior horn of the lateral meniscus. Mild myxoid degeneration anterior cruciate ligament with largely intact fibers. Mild to moderate medial compartment and milder patellofemoral compartment chondromalacia. No degenerative edema. Mild to moderate joint effusion.   Suggestion of mild lateral tracking of the patella which may be exaggerated by positioning and joint fluid. Recommend clinical correlation     Objective:   Physical Exam Vitals:   12/12/23 1458  BP: 134/84   Const: appears well, non-toxic, well groomed Psych: affect bright, interactive, smiling EENT: EOMI intact, conjunctiva appear normal Neck: no obvious masses, appears symmetric Resp: non-labored, appears symmetric Neuro: muscle bulk appears normal Skin: no obvious rashes noted   Physical examination of the right knee today was deferred     Assessment & Plan:   Victor Martinez is a 38 y.o. male with evidence of small peripheral tearing of the anterior horn of the lateral meniscus of his right knee as well as myxoid degeneration of his ACL leading to feelings of instability.  I discussed treatment options for him including injectable therapy such as intra-articular corticosteroid, platelet rich plasma, as well as referral for  arthroscopic meniscal repair.  Ultimately, I will discuss with some of my surgery colleagues and get back to him regarding what I believed to be the best course of action given the timeline of when his injury occurred.

## 2023-12-14 ENCOUNTER — Ambulatory Visit (INDEPENDENT_AMBULATORY_CARE_PROVIDER_SITE_OTHER): Admitting: Podiatry

## 2023-12-14 ENCOUNTER — Encounter: Payer: Self-pay | Admitting: Podiatry

## 2023-12-14 DIAGNOSIS — M722 Plantar fascial fibromatosis: Secondary | ICD-10-CM | POA: Diagnosis not present

## 2023-12-14 NOTE — Progress Notes (Signed)
  Subjective:  Patient ID: Victor Martinez, male    DOB: Oct 09, 1985,   MRN: [id number]  Chief Complaint  Patient presents with   Plantar Fasciitis    It's about the same.    38 y.o. male presents for follow-up of left plantar fasciitis. Relates injeciton helped for two days. He has been stretching and wearing brace but still getting sore after first steps in morning especially with previous activity. SABRA  Has been doing PT and not much change.  Denies any other pedal complaints. Denies n/v/f/c.   Past Medical History:  Diagnosis Date   ADHD (attention deficit hyperactivity disorder)    Mild   Allergies    Anxiety    Hyperlipidemia    Insomnia     Objective:  Physical Exam: Vascular: DP/PT pulses 2/4 bilateral. CFT <3 seconds. Normal hair growth on digits. No edema.  Skin. No lacerations or abrasions bilateral feet.  Musculoskeletal: MMT 5/5 bilateral lower extremities in DF, PF, Inversion and Eversion. Deceased ROM in DF of ankle joint. Tender to the medial calcaneal tubercle left pain more proximally along healing and some pain in distal insertion of achilles . No pain with achilles proximally, PT or arch. No pain with calcaneal squeeze.  Neurological: Sensation intact to light touch.   Assessment:   1. Plantar fasciitis of left foot       Plan:  Patient was evaluated and treated and all questions answered. Discussed plantar fasciitis with patient.  X-rays reviewed and discussed with patient. No acute fractures or dislocations noted. Minimal spurring noted at inferior calcaneus.  Discussed treatment options including, ice, NSAIDS, supportive shoes, bracing, and stretching. Continue stretching and brace  Continue stretching and support  Discussed surgery vs PRP injection and patient would like to consider PRP injection.  Follow-up for PRP injection.      Asberry Failing, DPM

## 2023-12-17 DIAGNOSIS — S83261D Peripheral tear of lateral meniscus, current injury, right knee, subsequent encounter: Secondary | ICD-10-CM

## 2023-12-24 ENCOUNTER — Ambulatory Visit (INDEPENDENT_AMBULATORY_CARE_PROVIDER_SITE_OTHER): Payer: Self-pay

## 2023-12-24 ENCOUNTER — Other Ambulatory Visit (INDEPENDENT_AMBULATORY_CARE_PROVIDER_SITE_OTHER): Payer: Self-pay

## 2023-12-24 VITALS — BP 120/80 | Ht 72.0 in | Wt 220.0 lb

## 2023-12-24 DIAGNOSIS — S83261D Peripheral tear of lateral meniscus, current injury, right knee, subsequent encounter: Secondary | ICD-10-CM | POA: Diagnosis not present

## 2023-12-24 NOTE — Patient Instructions (Signed)
 Post-PRP Injection Instructions  After your platelet-rich plasma (PRP) injection into the knee joint, please follow these instructions to help maximize the benefits of the treatment and reduce discomfort:      **Activities to Do:**      - Begin gentle range-of-motion exercises for the knee within 24 hours after the injection to maintain joint mobility. Passive flexion and extension movements are recommended initially.      - Starting the day after the injection, perform a home-based rehabilitation program focusing on isometric strengthening of the knee and hip muscles, as tolerated. This may include gentle quadriceps and hamstring contractions without resistance.      - Use ice or cold packs on the knee for 15-20 minutes several times daily to reduce inflammation and pain.      - Rest the knee as needed, but avoid prolonged immobilization.      **Activities to Avoid:**      - Avoid strenuous activities, heavy lifting, running, jumping, or high-impact exercises for at least 5 to 7 days post-injection to prevent exacerbation of inflammation.      - Refrain from using nonsteroidal anti-inflammatory drugs (NSAIDs) such as ibuprofen or naproxen for at least 2 weeks after the injection, as they may interfere with the healing process promoted by PRP.      - Limit excessive weight-bearing or prolonged standing during the first few days after the injection.      - Avoid massage or heat application to the knee for the first 48 hours.      **Pain Management:**      - Acetaminophen (paracetamol) may be used for pain relief as needed.      - If pain or swelling worsens significantly or persists beyond a few days, contact your healthcare provider.      **Follow-Up:**      - Schedule a follow-up appointment approximately 4 to 6 weeks after the injection to assess your response to treatment and discuss further management.      - Additional PRP injections may be recommended at regular intervals or  as clinically indicated, depending on your progress and the severity of your knee condition.      These recommendations are based on current clinical studies and expert consensus supporting the safety and efficacy of PRP for knee osteoarthritis, emphasizing gradual return to activity and avoidance of anti-inflammatory medications that may blunt the regenerative effects of PRP.[1][2][3][4][5]      Please contact your healthcare provider if you experience severe pain, signs of infection (redness, warmth, fever), or any other concerning symptoms.   ------------------------------------------------------------------------------------------------ ### References  1. Case Report: Rehabilitation After Platelet-Rich Growth Factors' Intra-Articular Injections for Knee Osteoarthritis: Two Case Reports of a Home-Based Protocol. Negrini F, De Lucia F, Negrini S, et al. Building control surveyor in Pharmacology. 2021;12:718060. doi:10.3389/fphar.2021.718060.  2. Postinjection Protocols Following Platelet-Rich Plasma Administration for Knee Osteoarthritis: A Systematic Review. Claryce OP, Kondapi K, Tjong VK, Gohal C. PM & R : The Journal of Injury, Function, and Rehabilitation. 2024;16(9):1023-1029. doi:10.1002/pmrj.13139.  3. Effect of Intra-articular Platelet-Rich Plasma vs Placebo Injection on Pain and Medial Tibial Cartilage Volume in Patients With Knee Osteoarthritis: The RESTORE Randomized Clinical Trial. Merril FUS, Yolande FUS, Metcalf BR, et al. JAMA. 2021;326(20):2021-2030. doi:10.1001/jama.7978.80584.  4. Consensus Guidelines on Interventional Therapies for Knee Pain (STEP Guidelines) From the American Society of Pain and Neuroscience. Hunter CW, Deer TR, Jones MR, et al. Journal of Pain Research. 2022;15:2683-2745. doi:10.2147/JPR.D629530.  5. Post-Procedure Protocols Following Platelet-Rich Plasma Injections for Tendinopathy: A Systematic Review.  Sharmaine BROCKS, Von Rickenbach KJ, Bailowitz Z, Gellhorn AC. PM & R : The  Journal of Injury, Function, and Rehabilitation. 2020;12(9):904-915. doi:10.1002/pmrj.87652.

## 2023-12-24 NOTE — Progress Notes (Unsigned)
   Subjective:    Patient ID: TYLIEK TIMBERMAN, male    DOB: 38 y.o., [birth year]   MRN: [id number]  Chief Complaint: Right knee osteoarthritis, lateral meniscal tear, ACL myxoid degeneration  Matt endorses continued pain in his right knee particularly with descending stairs and running.     Objective:   Vitals:   12/24/23 1136  BP: 120/80    Ultrasound Guided Platelet-Rich Plasma Intra-articular Knee Injection Procedure Note MONTRICE GRACEY 09-18-1985 Indications: Pain Procedure Details Following the description of risks including infection, bleeding, damage to surrounding structures, patient provided written consent for Right knee joint injection procedure. US  was used to identify the suprapatellar pouch. Patient was sterilely prepped in the usual fashion with alcohol.  Following topical anesthetization with ethyl chloride they were injected with ~5cc's of platelet rich plasma via the superolateral approach into the suprapatellar pouch. This was well visualized under ultrasound, please see associated photographic documentation. Patient tolerated well without complication.  Precautions provided. Cleaned and dressing applied.     Assessment & Plan:   Extensive discussion had regarding injection mechanism of action, avoidance of ice/NSAIDs, recommended activities post-injection, expected timeline for improvement, and follow up. Provided with handout detailing these as well. Pt tolerated procedure well without complication. Follow up in 6 weeks. If pain still persisting at that time, can consider repeat PRP or pivot to HA.

## 2024-01-02 ENCOUNTER — Encounter: Payer: Self-pay | Admitting: Podiatry

## 2024-01-02 ENCOUNTER — Ambulatory Visit (INDEPENDENT_AMBULATORY_CARE_PROVIDER_SITE_OTHER): Admitting: Podiatry

## 2024-01-02 DIAGNOSIS — M722 Plantar fascial fibromatosis: Secondary | ICD-10-CM | POA: Diagnosis not present

## 2024-01-02 NOTE — Progress Notes (Signed)
  Subjective:  Patient ID: Victor Martinez, male    DOB: 02/08/1986,   MRN: [id number]  No chief complaint on file.   38 y.o. male presents for follow-up of left plantar fasciitis. Here today for PRP injection.  Denies any other pedal complaints. Denies n/v/f/c.   Past Medical History:  Diagnosis Date   ADHD (attention deficit hyperactivity disorder)    Mild   Allergies    Anxiety    Hyperlipidemia    Insomnia     Objective:  Physical Exam: Vascular: DP/PT pulses 2/4 bilateral. CFT <3 seconds. Normal hair growth on digits. No edema.  Skin. No lacerations or abrasions bilateral feet.  Musculoskeletal: MMT 5/5 bilateral lower extremities in DF, PF, Inversion and Eversion. Deceased ROM in DF of ankle joint. Tender to the medial calcaneal tubercle left pain more proximally along healing and some pain in distal insertion of achilles . No pain with achilles proximally, PT or arch. No pain with calcaneal squeeze.  Neurological: Sensation intact to light touch.   Assessment:   1. Plantar fasciitis of left foot       Plan:  Patient was evaluated and treated and all questions answered. Discussed plantar fasciitis with patient.  X-rays reviewed and discussed with patient. No acute fractures or dislocations noted. Minimal spurring noted at inferior calcaneus.  Discussed treatment options including, ice, NSAIDS, supportive shoes, bracing, and stretching. Continue stretching and brace  Continue stretching and support  PRP injection procedure below.  Ultrasound preformed as well with imaging saved. Noted about 6 cm of thickness to plantar fascia.  Advised on post care  Follow-up in 4 weeks for recheck   Procedure: Injection Tendon/Ligament Discussed alternatives, risks, complications and verbal consent was obtained.  Location: Right plantar fascia . Skin Prep: Alcohol. Injectate: 3cc 2% lidocaine  plain, 4 cc of PRP injectate.   Disposition: Patient tolerated procedure well. Injection  site dressed with a band-aid.  Post-injection care was discussed and return precautions discussed.       Asberry Failing, DPM

## 2024-01-29 ENCOUNTER — Other Ambulatory Visit

## 2024-02-01 ENCOUNTER — Ambulatory Visit: Admitting: Podiatry

## 2024-02-05 ENCOUNTER — Ambulatory Visit

## 2024-02-05 ENCOUNTER — Other Ambulatory Visit: Payer: Self-pay

## 2024-02-05 VITALS — BP 120/78 | Ht 72.0 in | Wt 220.0 lb

## 2024-02-05 DIAGNOSIS — S83261D Peripheral tear of lateral meniscus, current injury, right knee, subsequent encounter: Secondary | ICD-10-CM

## 2024-02-05 DIAGNOSIS — M25469 Effusion, unspecified knee: Secondary | ICD-10-CM | POA: Diagnosis not present

## 2024-02-05 DIAGNOSIS — M7651 Patellar tendinitis, right knee: Secondary | ICD-10-CM | POA: Diagnosis not present

## 2024-02-05 MED ORDER — NITROGLYCERIN 0.2 MG/HR TD PT24: 0.2000 mg | MEDICATED_PATCH | Freq: Every day | TRANSDERMAL | 1 refills | Status: DC

## 2024-02-05 NOTE — Progress Notes (Signed)
   Subjective:    Patient ID: Victor Martinez, male    DOB: 38 y.o., January 12, 1986   MRN: [id number]  Chief Complaint: Right Knee Lateral Meniscus Tear follow up (7 weeks)  History of Present Illness  Continued pain Pain now while going up and down stairs Previously had pain mostly with just running and going downstairs Also previously a sensation of instability was his more predominant symptom with a lesser degree of pain Now pain is the predominating symptom Right knee feels swollen     Objective:   There were no vitals filed for this visit.  Const: appears well, non-toxic, well groomed Psych: affect bright, interactive, smiling EENT: EOMI intact, conjunctiva appear normal Neck: no obvious masses, appears symmetric Resp: non-labored, appears symmetric Neuro: muscle bulk appears normal Skin: no obvious rashes noted  Right knee: Visible effusion Nontender over the medial and lateral joint lines Discomfort with patellar grind Tenderness to palpation in the patellar tendon Pain reproduced with activation of the extensor mechanism Worse pain with knee extension while the knee is at 30 degrees and 90 degrees of flexion (much more so than if the knee is at 0 degrees flexion)   Limited ultrasound evaluation of the right knee: Fiber disruption and hypoechogenic fluid signal coming from the proximal patellar tendon well-visualized in both short and long axis Prominent hypoechogenic fluid signal present within the suprapatellar pouch The quadriceps tendon is well-visualized and appears normal The medial meniscus is well-visualized with limited exam and appears normal The lateral meniscus is well-visualized and appears to have a fissure tracking within it with a small degree of extrusion but this is similar compared to prior exam. Interpretation: Large effusion Patellar tendinopathy with partial tearing Peripheral Lateral meniscus tear (similar to prior)   Extracorporeal Shockwave  Therapy Procedure Following the description of risks including pain, bruising, local skin irritation, damage to surrounding structures, patient provided verbal/written consent for ESWT procedure. Palpation was used to identify the right patellar tendon. Patient and probe was sterilely prepped in the usual fashion with alcohol.  Total strikes: 2000 Intensity: 70 Frequency: 12  Patient tolerated well without complication. Precautions provided.       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  He has pain refractory to a PRP injection with prominent effusion on ultrasound today and a newly visualized patellar tendon partial tear.  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  I recommend starting shockwave therapy for the patellar tendon as well as nitroglycerin  patches and we will switch to a more aggressive physical therapy approach at [facility].  To further help assess treatment options for him, I will also refer him to [facility] in [place].

## 2024-02-12 ENCOUNTER — Ambulatory Visit (INDEPENDENT_AMBULATORY_CARE_PROVIDER_SITE_OTHER): Payer: Self-pay

## 2024-02-12 DIAGNOSIS — M7651 Patellar tendinitis, right knee: Secondary | ICD-10-CM

## 2024-02-12 DIAGNOSIS — M25569 Pain in unspecified knee: Secondary | ICD-10-CM | POA: Diagnosis not present

## 2024-02-12 NOTE — Progress Notes (Signed)
   Subjective:    Patient ID: Victor Martinez, male    DOB: 38 y.o., October 21, 1985   MRN: [id number]  Chief Complaint: Patellar tendinopathy - shockwave #2  History of Present Illness Patient diagnosed with patellar tendinopathy by myself on 02/05/2024 via ultrasound and physical exam. Provided with shockwave therapy treatment #1 at that time     Objective:   Right patellar tendon Extracorporeal Shockwave Therapy Procedure Following the description of risks including pain, bruising, local skin irritation, damage to surrounding structures, patient provided verbal/written consent for ESWT procedure. Palpation was used to identify the right patellar tendon. Patient and probe was sterilely prepped in the usual fashion with alcohol.  Total strikes: 2000 Intensity: 110 Frequency: 12  Patient tolerated well without complication. Precautions provided.      Assessment & Plan:   Assessment & Plan Victor Martinez tolerated treatment #2 well.  Continue PT with [facility].  Plan to follow-up in 1 week for treatment #3.

## 2024-02-18 DIAGNOSIS — M25569 Pain in unspecified knee: Secondary | ICD-10-CM | POA: Diagnosis not present

## 2024-02-19 ENCOUNTER — Ambulatory Visit

## 2024-02-19 DIAGNOSIS — M25561 Pain in right knee: Secondary | ICD-10-CM | POA: Diagnosis not present

## 2024-02-21 DIAGNOSIS — M25569 Pain in unspecified knee: Secondary | ICD-10-CM | POA: Diagnosis not present

## 2024-02-25 DIAGNOSIS — M25569 Pain in unspecified knee: Secondary | ICD-10-CM | POA: Diagnosis not present

## 2024-02-26 ENCOUNTER — Ambulatory Visit: Payer: Self-pay

## 2024-02-26 VITALS — Ht 72.0 in

## 2024-02-26 DIAGNOSIS — M7651 Patellar tendinitis, right knee: Secondary | ICD-10-CM

## 2024-02-26 NOTE — Progress Notes (Signed)
° °  Subjective:    Patient ID: Victor Martinez, male    DOB: 38 y.o., 07-Oct-1985   MRN: [id number]  Chief Complaint: Patellar tendinopathy - shockwave #3  History of Present Illness Patient diagnosed with patellar tendinopathy by myself on 02/05/2024 via ultrasound and physical exam. So far is minimally improved     Objective:   Right patellar tendon Extracorporeal Shockwave Therapy Procedure Following the description of risks including pain, bruising, local skin irritation, damage to surrounding structures, patient provided verbal/written consent for ESWT procedure. Palpation was used to identify the right patellar tendon. Patient and probe was sterilely prepped in the usual fashion with alcohol.  Total strikes: 2000 Intensity: 120 Frequency: 12  Patient tolerated well without complication. Precautions provided.    Limited ultrasound evaluation of the right patellar tendon reveals minimal to no increased Doppler uptake surrounding the patellar tendon.     Assessment & Plan:   Assessment & Plan Matt tolerated treatment #3 well.  Continue PT with [facility].  Plan to follow-up in 1 week for treatment #4.  Plan for 4-5 shockwave treatments and then proceed with PRP.  Consider increasing to 3/4 patch nitroglycerin .

## 2024-02-29 DIAGNOSIS — M25569 Pain in unspecified knee: Secondary | ICD-10-CM | POA: Diagnosis not present

## 2024-03-03 ENCOUNTER — Ambulatory Visit: Payer: Self-pay

## 2024-03-03 VITALS — Ht 72.0 in | Wt 220.0 lb

## 2024-03-03 DIAGNOSIS — M7651 Patellar tendinitis, right knee: Secondary | ICD-10-CM

## 2024-03-03 NOTE — Progress Notes (Signed)
" ° °  Subjective:    Patient ID: Victor Martinez, male    DOB: 38 y.o., [birth year]   MRN: [id number]  Chief Complaint: Patellar tendinopathy - shockwave #4  History of Present Illness Patient diagnosed with patellar tendinopathy by myself on 02/05/2024 via ultrasound and physical exam. Better stability since last visit.     Objective:   Right patellar tendon Extracorporeal Shockwave Therapy Procedure Following the description of risks including pain, bruising, local skin irritation, damage to surrounding structures, patient provided verbal/written consent for ESWT procedure. Palpation was used to identify the right patellar tendon. Patient and probe was sterilely prepped in the usual fashion with alcohol.  Total strikes: 2000 Intensity: 185 Frequency: 10  Patient tolerated well without complication. Precautions provided.    Limited ultrasound evaluation of the right patellar tendon reveals minimal to no increased Doppler uptake surrounding the patellar tendon.     Assessment & Plan:   Assessment & Plan Victor Martinez tolerated treatment #4 well.  Continue PT with [facility].  Plan to follow-up in 2 weeks for treatment #5.  Plan for 4-5 shockwave treatments and then proceed with PRP.  Consider increasing to 3/4 patch nitroglycerin .    "

## 2024-03-18 ENCOUNTER — Ambulatory Visit

## 2024-03-19 ENCOUNTER — Ambulatory Visit: Payer: Self-pay

## 2024-03-19 DIAGNOSIS — M25469 Effusion, unspecified knee: Secondary | ICD-10-CM

## 2024-03-19 DIAGNOSIS — S83261D Peripheral tear of lateral meniscus, current injury, right knee, subsequent encounter: Secondary | ICD-10-CM

## 2024-03-19 DIAGNOSIS — M7651 Patellar tendinitis, right knee: Secondary | ICD-10-CM

## 2024-03-19 MED ORDER — NITROGLYCERIN 0.2 MG/HR TD PT24: 0.2000 mg | MEDICATED_PATCH | Freq: Every day | TRANSDERMAL | 1 refills | Status: DC

## 2024-03-19 NOTE — Progress Notes (Signed)
" ° °  Subjective:    Patient ID: Victor Martinez, male    DOB: 39 y.o., [birth year]   MRN: [id number]  Chief Complaint: Patellar tendinopathy - shockwave #5  History of Present Illness Patient diagnosed with patellar tendinopathy by myself on 02/05/2024 via ultrasound and physical exam. Felt great initially after last shockwave session, though pain returned    Objective:   Right patellar tendon Extracorporeal Shockwave Therapy Procedure Following the description of risks including pain, bruising, local skin irritation, damage to surrounding structures, patient provided verbal/written consent for ESWT procedure. Palpation was used to identify the right patellar tendon. Patient and probe was sterilely prepped in the usual fashion with alcohol.  Total strikes: 2000 Intensity: 185 Frequency: 10  Patient tolerated well without complication. Precautions provided.      Assessment & Plan:   Assessment & Plan Matt tolerated treatment #5 well.  Continue PT with [facility].  Follow-up in 2 weeks for PRP of patellar tendon.  Continue nitroglycerin  at one half patch, refill provided.    "

## 2024-03-21 MED ORDER — NITROGLYCERIN 0.2 MG/HR TD PT24: 0.2000 mg | MEDICATED_PATCH | Freq: Every day | TRANSDERMAL | 1 refills | Status: AC

## 2024-04-01 ENCOUNTER — Ambulatory Visit

## 2024-04-29 ENCOUNTER — Ambulatory Visit: Admitting: Physician Assistant

## 2024-05-26 ENCOUNTER — Encounter: Admitting: Internal Medicine
